# Patient Record
Sex: Male | Born: 1990 | Race: White | Hispanic: No | Marital: Married | Smoking: Former smoker
Health system: Southern US, Community
[De-identification: ages and names within clinical notes are randomized; demographics above are authoritative.]

## PROBLEM LIST (undated history)

## (undated) HISTORY — PX: NO PAST SURGERIES: SHX2092

---
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
## 2015-04-16 ENCOUNTER — Emergency Department: Payer: PRIVATE HEALTH INSURANCE

## 2015-04-16 ENCOUNTER — Emergency Department
Admission: EM | Admit: 2015-04-16 | Discharge: 2015-04-16 | Disposition: A | Discharge status: Home / Self Care | Payer: PRIVATE HEALTH INSURANCE | Attending: Emergency Medicine | Admitting: Emergency Medicine

## 2015-04-16 DIAGNOSIS — N201 Calculus of ureter: Secondary | ICD-10-CM | POA: Insufficient documentation

## 2015-04-16 DIAGNOSIS — R109 Unspecified abdominal pain: Secondary | ICD-10-CM | POA: Diagnosis present

## 2015-04-16 LAB — URINALYSIS COMPLETE WITH MICROSCOPIC (ARMC ONLY)
BILIRUBIN URINE: NEGATIVE
Bacteria, UA: NONE SEEN
Glucose, UA: NEGATIVE mg/dL
Ketones, ur: NEGATIVE mg/dL
Leukocytes, UA: NEGATIVE
Nitrite: NEGATIVE
PH: 6 (ref 5.0–8.0)
Protein, ur: 30 mg/dL — AB
Specific Gravity, Urine: 1.025 (ref 1.005–1.030)

## 2015-04-16 LAB — BASIC METABOLIC PANEL
Anion gap: 9 (ref 5–15)
BUN: 14 mg/dL (ref 6–20)
CALCIUM: 9.1 mg/dL (ref 8.9–10.3)
CHLORIDE: 105 mmol/L (ref 101–111)
CO2: 23 mmol/L (ref 22–32)
CREATININE: 1.43 mg/dL — AB (ref 0.61–1.24)
GFR calc Af Amer: >60 mL/min (ref >60)
GFR calc non Af Amer: >60 mL/min (ref >60)
Glucose, Bld: 134 mg/dL — ABNORMAL HIGH (ref 65–99)
Potassium: 4.1 mmol/L (ref 3.5–5.1)
SODIUM: 137 mmol/L (ref 135–145)

## 2015-04-16 LAB — CBC WITH DIFFERENTIAL/PLATELET
Basophils Absolute: 0.1 10*3/uL (ref 0–0.1)
Basophils Relative: 1 %
EOS ABS: 0.2 10*3/uL (ref 0–0.7)
EOS PCT: 2 %
HCT: 43.4 % (ref 40.0–52.0)
HEMOGLOBIN: 14.8 g/dL (ref 13.0–18.0)
LYMPHS ABS: 4.4 10*3/uL — AB (ref 1.0–3.6)
Lymphocytes Relative: 44 %
MCH: 29.5 pg (ref 26.0–34.0)
MCHC: 34.1 g/dL (ref 32.0–36.0)
MCV: 86.3 fL (ref 80.0–100.0)
MONO ABS: 0.9 10*3/uL (ref 0.2–1.0)
MONOS PCT: 9 %
NEUTROS PCT: 44 %
Neutro Abs: 4.5 10*3/uL (ref 1.4–6.5)
Platelets: 318 10*3/uL (ref 150–440)
RBC: 5.02 MIL/uL (ref 4.40–5.90)
RDW: 12.8 % (ref 11.5–14.5)
WBC: 10.1 10*3/uL (ref 3.8–10.6)

## 2015-04-16 MED ORDER — SODIUM CHLORIDE 0.9 % IV BOLUS (SEPSIS)
1000.0000 mL | Freq: Once | INTRAVENOUS | Status: AC
  Administered 2015-04-16: 1000 mL via INTRAVENOUS

## 2015-04-16 MED ORDER — KETOROLAC TROMETHAMINE 30 MG/ML IJ SOLN
30.0000 mg | Freq: Once | INTRAMUSCULAR | Status: AC
  Administered 2015-04-16: 30 mg via INTRAVENOUS

## 2015-04-16 MED ORDER — OXYCODONE-ACETAMINOPHEN 5-325 MG PO TABS
2.0000 | ORAL_TABLET | Freq: Once | ORAL | Status: AC
  Administered 2015-04-16: 2 via ORAL
  Filled 2015-04-16: qty 2

## 2015-04-16 MED ORDER — ONDANSETRON HCL 4 MG/2ML IJ SOLN
4.0000 mg | Freq: Once | INTRAMUSCULAR | Status: AC
  Administered 2015-04-16: 4 mg via INTRAVENOUS

## 2015-04-16 MED ORDER — OXYCODONE-ACETAMINOPHEN 5-325 MG PO TABS: 1.0000 | ORAL_TABLET | Freq: Four times a day (QID) | ORAL | Status: DC | PRN

## 2015-04-16 MED ORDER — MORPHINE SULFATE (PF) 4 MG/ML IV SOLN
INTRAVENOUS | Status: AC
  Administered 2015-04-16: 4 mg via INTRAVENOUS
  Filled 2015-04-16: qty 1

## 2015-04-16 MED ORDER — ONDANSETRON HCL 4 MG/2ML IJ SOLN
INTRAMUSCULAR | Status: AC
  Administered 2015-04-16: 4 mg via INTRAVENOUS
  Filled 2015-04-16: qty 2

## 2015-04-16 MED ORDER — ONDANSETRON 4 MG PO TBDP: 4.0000 mg | ORAL_TABLET | Freq: Three times a day (TID) | ORAL | Status: DC | PRN

## 2015-04-16 MED ORDER — KETOROLAC TROMETHAMINE 30 MG/ML IJ SOLN
INTRAMUSCULAR | Status: AC
  Administered 2015-04-16: 30 mg via INTRAVENOUS
  Filled 2015-04-16: qty 1

## 2015-04-16 MED ORDER — MORPHINE SULFATE (PF) 4 MG/ML IV SOLN
4.0000 mg | Freq: Once | INTRAVENOUS | Status: AC
  Administered 2015-04-16: 4 mg via INTRAVENOUS

## 2015-04-16 MED ORDER — TAMSULOSIN HCL 0.4 MG PO CAPS: 0.4000 mg | ORAL_CAPSULE | Freq: Every day | ORAL | Status: DC

## 2015-04-16 NOTE — ED Provider Notes (Signed)
[facility] Emergency Department Provider Note  Time seen: 6:35 AM  I have reviewed the triage vital signs and the nursing notes.   HISTORY  Chief Complaint Flank Pain    HPI Samuel Davila is a 25 y.o. male with no past medical history who presents the emergency department left flank pain beginning approximate 5 hours ago. According to the patient around 1 AM he began with severe left flank pain sharp in quality. Nausea but denies vomiting. Denies history of kidney stones in the past. Denies fever, dysuria or hematuria.   No past medical history on file.  There are no active problems to display for this patient.   No past surgical history on file.  No current outpatient prescriptions on file.  Allergies Review of patient's allergies indicates no known allergies.  No family history on file.  Social History Social History  Substance Use Topics  . Smoking status: Not on file  . Smokeless tobacco: Not on file  . Alcohol Use: Not on file    Review of Systems Constitutional: Negative for fever. Cardiovascular: Negative for chest pain. Respiratory: Negative for shortness of breath. Gastrointestinal: Left flank pain. Positive for nausea. Negative for vomiting or diarrhea. Genitourinary: Negative for dysuria. Musculoskeletal: Moderate left back pain 10-point ROS otherwise negative.  ____________________________________________   PHYSICAL EXAM:  VITAL SIGNS: ED Triage Vitals  Enc Vitals Group     BP 04/16/15 0406 155/123 mmHg     Pulse Rate 04/16/15 0406 89     Resp 04/16/15 0406 18     Temp 04/16/15 0406 97.9 F (36.6 C)     Temp src --      SpO2 04/16/15 0406 98 %     Weight 04/16/15 0406 250 lb (113.399 kg)     Height --      Head Cir --      Peak Flow --      Pain Score 04/16/15 0407 8     Pain Loc --      Pain Edu? --      Excl. in GC? --     Constitutional: Alert and oriented. Well appearing and in no distress. Eyes: Normal  exam ENT   Head: Normocephalic and atraumatic.   Mouth/Throat: Mucous membranes are moist. Cardiovascular: Normal rate, regular rhythm. No murmur Respiratory: Normal respiratory effort without tachypnea nor retractions. Breath sounds are clear Gastrointestinal: Soft and nontender. No distention.  No CVA tenderness Musculoskeletal: Nontender with normal range of motion in all extremities. Neurologic:  Normal speech and language. No gross focal neurologic deficits Skin:  Skin is warm, dry and intact.  Psychiatric: Mood and affect are normal.   ____________________________________________   RADIOLOGY  CT shows left UPJ stone, 3 mm.   INITIAL IMPRESSION / ASSESSMENT AND PLAN / ED COURSE  Pertinent labs & imaging results that were available during my care of the patient were reviewed by me and considered in my medical decision making (see chart for details).  CT consistent with 3 mm left-sided ureteral pelvic junction stone. Urinalysis consistent with hematuria. Normal white blood cell count. Afebrile, nitrite negative. Exam consistent with kidney stone. Patient initially very uncomfortable with considerable amount of pain however received morphine and Toradol prior to my evaluation. Upon my evaluation patient rates the pain is a 5/10 currently. We will dose Percocet, discharge with Percocet, Zofran, Flomax, urinary strainer and urology follow-up as this is the patient's first stone. Patient agreeable to plan.  ____________________________________________   FINAL CLINICAL  IMPRESSION(S) / ED DIAGNOSES  Left ureterolithiasis   Minna AntisKevin Samara Stankowski, MD 04/16/15 [phone]

## 2015-04-16 NOTE — ED Notes (Signed)
MD at bedside. 

## 2015-04-16 NOTE — ED Notes (Signed)

## 2015-04-16 NOTE — ED Notes (Signed)
Pt reports sudden onset left sided flank pain. Pt is pacing in triage and visible in mild distress

## 2015-04-16 NOTE — Discharge Instructions (Signed)
Kidney Stones °Kidney stones (urolithiasis) are deposits that form inside your kidneys. The intense pain is caused by the stone moving through the urinary tract. When the stone moves, the ureter goes into spasm around the stone. The stone is usually passed in the urine.  °CAUSES  °· A disorder that makes certain neck glands produce too much parathyroid hormone (primary hyperparathyroidism). °· A buildup of uric acid crystals, similar to gout in your joints. °· Narrowing (stricture) of the ureter. °· A kidney obstruction present at birth (congenital obstruction). °· Previous surgery on the kidney or ureters. °· Numerous kidney infections. °SYMPTOMS  °· Feeling sick to your stomach (nauseous). °· Throwing up (vomiting). °· Blood in the urine (hematuria). °· Pain that usually spreads (radiates) to the groin. °· Frequency or urgency of urination. °DIAGNOSIS  °· Taking a history and physical exam. °· Blood or urine tests. °· CT scan. °· Occasionally, an examination of the inside of the urinary bladder (cystoscopy) is performed. °TREATMENT  °· Observation. °· Increasing your fluid intake. °· Extracorporeal shock wave lithotripsy--This is a noninvasive procedure that uses shock waves to break up kidney stones. °· Surgery may be needed if you have severe pain or persistent obstruction. There are various surgical procedures. Most of the procedures are performed with the use of small instruments. Only small incisions are needed to accommodate these instruments, so recovery time is minimized. °The size, location, and chemical composition are all important variables that will determine the proper choice of action for you. Talk to your health care provider to better understand your situation so that you will minimize the risk of injury to yourself and your kidney.  °HOME CARE INSTRUCTIONS  °· Drink enough water and fluids to keep your urine clear or pale yellow. This will help you to pass the stone or stone fragments. °· Strain  all urine through the provided strainer. Keep all particulate matter and stones for your health care provider to see. The stone causing the pain may be as small as a grain of salt. It is very important to use the strainer each and every time you pass your urine. The collection of your stone will allow your health care provider to analyze it and verify that a stone has actually passed. The stone analysis will often identify what you can do to reduce the incidence of recurrences. °· Only take over-the-counter or prescription medicines for pain, discomfort, or fever as directed by your health care provider. °· Keep all follow-up visits as told by your health care provider. This is important. °· Get follow-up X-rays if required. The absence of pain does not always mean that the stone has passed. It may have only stopped moving. If the urine remains completely obstructed, it can cause loss of kidney function or even complete destruction of the kidney. It is your responsibility to make sure X-rays and follow-ups are completed. Ultrasounds of the kidney can show blockages and the status of the kidney. Ultrasounds are not associated with any radiation and can be performed easily in a matter of minutes. °· Make changes to your daily diet as told by your health care provider. You may be told to: °¨ Limit the amount of salt that you eat. °¨ Eat 5 or more servings of fruits and vegetables each day. °¨ Limit the amount of meat, poultry, fish, and eggs that you eat. °· Collect a 24-hour urine sample as told by your health care provider. You may need to collect another urine sample every 6-12   months. °SEEK MEDICAL CARE IF: °· You experience pain that is progressive and unresponsive to any pain medicine you have been prescribed. °SEEK IMMEDIATE MEDICAL CARE IF:  °· Pain cannot be controlled with the prescribed medicine. °· You have a fever or shaking chills. °· The severity or intensity of pain increases over 18 hours and is not  relieved by pain medicine. °· You develop a new onset of abdominal pain. °· You feel faint or pass out. °· You are unable to urinate. °  °This information is not intended to replace advice given to you by your health care provider. Make sure you discuss any questions you have with your health care provider. °  °Document Released: 01/23/2005 Document Revised: 10/14/2014 Document Reviewed: 06/26/2012 °Elsevier Interactive Patient Education ©2016 [employer] ° °

## 2015-04-16 NOTE — ED Notes (Signed)
Pt ambulated to treatment room with steady gait. Pt present to ED with c/o left flank pain, reports began approx 5 hours ago. (+) nausea. Pt denies vomiting, fever, dysuria or hematuria. Denies hx of kidney stones. Pt alert and oriented x 4, skin warm and dry, no increased work in breathing noted. Family at bedside.

## 2018-02-18 ENCOUNTER — Other Ambulatory Visit: Payer: Self-pay

## 2018-02-18 ENCOUNTER — Ambulatory Visit
Admission: EM | Admit: 2018-02-18 | Discharge: 2018-02-18 | Disposition: A | Discharge status: Home / Self Care | Payer: PRIVATE HEALTH INSURANCE | Attending: Family Medicine | Admitting: Family Medicine

## 2018-02-18 DIAGNOSIS — R05 Cough: Secondary | ICD-10-CM

## 2018-02-18 DIAGNOSIS — R059 Cough, unspecified: Secondary | ICD-10-CM

## 2018-02-18 DIAGNOSIS — J0100 Acute maxillary sinusitis, unspecified: Secondary | ICD-10-CM | POA: Insufficient documentation

## 2018-02-18 MED ORDER — HYDROCOD POLST-CPM POLST ER 10-8 MG/5ML PO SUER: 5.0000 mL | Freq: Two times a day (BID) | ORAL | 0 refills | Status: DC

## 2018-02-18 MED ORDER — AMOXICILLIN 875 MG PO TABS: 875.0000 mg | ORAL_TABLET | Freq: Two times a day (BID) | ORAL | 0 refills | Status: DC

## 2018-02-18 NOTE — ED Provider Notes (Signed)
[facility]    CSN: [id number] Arrival date & time: 02/18/18  0802     History   Chief Complaint Chief Complaint  Patient presents with  . Nasal Congestion    HPI Samuel Davila is a 28 y.o. male.   The history is provided by the patient.  URI  Presenting symptoms: congestion, cough, facial pain and fatigue   Severity:  Moderate Onset quality:  Sudden Duration:  1 week Timing:  Constant Progression:  Worsening Chronicity:  New Relieved by:  Nothing Ineffective treatments:  OTC medications Associated symptoms: sinus pain   Risk factors: sick contacts   Risk factors: not elderly     No past medical history on file.  There are no active problems to display for this patient.        Home Medications    Prior to Admission medications   Medication Sig Start Date End Date Taking? Authorizing Provider  ondansetron (ZOFRAN ODT) 4 MG disintegrating tablet Take 1 tablet (4 mg total) by mouth every 8 (eight) hours as needed for nausea or vomiting. 04/16/15  Yes Minna AntisPaduchowski, Kevin, MD  oxyCODONE-acetaminophen (ROXICET) 5-325 MG tablet Take 1 tablet by mouth every 6 (six) hours as needed. 04/16/15  Yes Minna AntisPaduchowski, Kevin, MD  tamsulosin (FLOMAX) 0.4 MG CAPS capsule Take 1 capsule (0.4 mg total) by mouth daily. 04/16/15  Yes Minna AntisPaduchowski, Kevin, MD  amoxicillin (AMOXIL) 875 MG tablet Take 1 tablet (875 mg total) by mouth 2 (two) times daily. 02/18/18   Payton Mccallumonty, Madysin Crisp, MD  chlorpheniramine-HYDROcodone (TUSSIONEX PENNKINETIC ER) 10-8 MG/5ML SUER Take 5 mLs by mouth 2 (two) times daily. 02/18/18   Payton Mccallumonty, Kauan Kloosterman, MD    Family History No family history on file.  Social History Social History   Tobacco Use  . Smoking status: Not on file  Substance Use Topics  . Alcohol use: Not on file  . Drug use: Not on file     Allergies   Patient has no known allergies.   Review of Systems Review of Systems  Constitutional: Positive for fatigue.  HENT: Positive for  congestion and sinus pain.   Respiratory: Positive for cough.      Physical Exam Triage Vital Signs ED Triage Vitals  Enc Vitals Group     BP 02/18/18 0819 (!) 154/90     Pulse Rate 02/18/18 0819 80     Resp 02/18/18 0819 18     Temp 02/18/18 0819 98.2 F (36.8 C)     Temp Source 02/18/18 0819 Oral     SpO2 02/18/18 0819 99 %     Weight 02/18/18 0816 260 lb (117.9 kg)     Height 02/18/18 0816 5\' 11"  (1.803 m)     Head Circumference --      Peak Flow --      Pain Score 02/18/18 0815 6     Pain Loc --      Pain Edu? --      Excl. in GC? --    No data found.  Updated Vital Signs BP (!) 154/90   Pulse 80   Temp 98.2 F (36.8 C) (Oral)   Resp 18   Ht 5\' 11"  (1.803 m)   Wt 117.9 kg   SpO2 99%   BMI 36.26 kg/m   Visual Acuity Right Eye Distance:   Left Eye Distance:   Bilateral Distance:    Right Eye Near:   Left Eye Near:    Bilateral Near:     Physical Exam  Vitals signs and nursing note reviewed.  Constitutional:      General: He is not in acute distress.    Appearance: He is well-developed. He is not diaphoretic.  HENT:     Head: Normocephalic and atraumatic.     Right Ear: Tympanic membrane, ear canal and external ear normal.     Left Ear: Tympanic membrane, ear canal and external ear normal.     Nose: Rhinorrhea present.     Right Sinus: Maxillary sinus tenderness present.     Left Sinus: Maxillary sinus tenderness present.     Mouth/Throat:     Pharynx: Uvula midline. No oropharyngeal exudate.     Tonsils: No tonsillar abscesses.  Eyes:     General: No scleral icterus.       Right eye: No discharge.        Left eye: No discharge.     Conjunctiva/sclera: Conjunctivae normal.     Pupils: Pupils are equal, round, and reactive to light.  Neck:     Musculoskeletal: Normal range of motion and neck supple.     Thyroid: No thyromegaly.     Trachea: No tracheal deviation.  Cardiovascular:     Rate and Rhythm: Normal rate and regular rhythm.     Heart  sounds: Normal heart sounds.  Pulmonary:     Effort: Pulmonary effort is normal. No respiratory distress.     Breath sounds: Normal breath sounds. No stridor. No wheezing or rales.  Chest:     Chest wall: No tenderness.  Lymphadenopathy:     Cervical: No cervical adenopathy.  Skin:    General: Skin is warm and dry.     Findings: No rash.  Neurological:     Mental Status: He is alert.      UC Treatments / Results  Labs (all labs ordered are listed, but only abnormal results are displayed) Labs Reviewed - No data to display  EKG None  Radiology No results found.  Procedures Procedures (including critical care time)  Medications Ordered in UC Medications - No data to display  Initial Impression / Assessment and Plan / UC Course  I have reviewed the triage vital signs and the nursing notes.  Pertinent labs & imaging results that were available during my care of the patient were reviewed by me and considered in my medical decision making (see chart for details).      Final Clinical Impressions(s) / UC Diagnoses   Final diagnoses:  Acute maxillary sinusitis, recurrence not specified  Cough     Discharge Instructions     Flonase steroid nasal spray Ibuprofen 600-800mg  three times daily    ED Prescriptions    Medication Sig Dispense Auth. Provider   amoxicillin (AMOXIL) 875 MG tablet Take 1 tablet (875 mg total) by mouth 2 (two) times daily. 20 tablet Payton Mccallumonty, Jailee Jaquez, MD   chlorpheniramine-HYDROcodone (TUSSIONEX PENNKINETIC ER) 10-8 MG/5ML SUER Take 5 mLs by mouth 2 (two) times daily. 60 mL Payton Mccallumonty, Hector Venne, MD     1. diagnosis reviewed with patient 2. rx as per orders above; reviewed possible side effects, interactions, risks and benefits  3. Recommend supportive treatment as above 4. Follow-up prn if symptoms worsen or don't improve   Controlled Substance Prescriptions [place] Controlled Substance Registry consulted? Not Applicable   Payton Mccallumonty, Farran Amsden,  MD 02/18/18 [phone]

## 2018-02-18 NOTE — Discharge Instructions (Addendum)
Flonase steroid nasal spray Ibuprofen 600-800mg  three times daily

## 2018-02-18 NOTE — ED Triage Notes (Signed)
Patient states he has a lot of sinus pain and congestion since last Wednesday.  Patient states he hurt his back coughing last night as well

## 2018-09-02 ENCOUNTER — Other Ambulatory Visit: Payer: Self-pay

## 2018-09-02 ENCOUNTER — Ambulatory Visit
Admission: EM | Admit: 2018-09-02 | Discharge: 2018-09-02 | Disposition: A | Discharge status: Home / Self Care | Payer: Self-pay | Attending: Family Medicine | Admitting: Family Medicine

## 2018-09-02 DIAGNOSIS — E860 Dehydration: Secondary | ICD-10-CM

## 2018-09-02 DIAGNOSIS — E876 Hypokalemia: Secondary | ICD-10-CM

## 2018-09-02 DIAGNOSIS — R112 Nausea with vomiting, unspecified: Secondary | ICD-10-CM

## 2018-09-02 LAB — CBC WITH DIFFERENTIAL/PLATELET
Abs Immature Granulocytes: 0.08 10*3/uL — ABNORMAL HIGH (ref 0.00–0.07)
Basophils Absolute: 0 10*3/uL (ref 0.0–0.1)
Basophils Relative: 0 %
Eosinophils Absolute: 0 10*3/uL (ref 0.0–0.5)
Eosinophils Relative: 0 %
HCT: 47 % (ref 39.0–52.0)
Hemoglobin: 16.3 g/dL (ref 13.0–17.0)
Immature Granulocytes: 1 %
Lymphocytes Relative: 22 %
Lymphs Abs: 2.6 10*3/uL (ref 0.7–4.0)
MCH: 30.2 pg (ref 26.0–34.0)
MCHC: 34.7 g/dL (ref 30.0–36.0)
MCV: 87 fL (ref 80.0–100.0)
Monocytes Absolute: 1 10*3/uL (ref 0.1–1.0)
Monocytes Relative: 9 %
Neutro Abs: 7.9 10*3/uL — ABNORMAL HIGH (ref 1.7–7.7)
Neutrophils Relative %: 68 %
Platelets: 361 10*3/uL (ref 150–400)
RBC: 5.4 MIL/uL (ref 4.22–5.81)
RDW: 12.7 % (ref 11.5–15.5)
WBC: 11.6 10*3/uL — ABNORMAL HIGH (ref 4.0–10.5)
nRBC: 0 % (ref 0.0–0.2)

## 2018-09-02 LAB — COMPREHENSIVE METABOLIC PANEL
ALT: 85 U/L — ABNORMAL HIGH (ref 0–44)
AST: 61 U/L — ABNORMAL HIGH (ref 15–41)
Albumin: 4.6 g/dL (ref 3.5–5.0)
Alkaline Phosphatase: 70 U/L (ref 38–126)
Anion gap: 15 (ref 5–15)
BUN: 16 mg/dL (ref 6–20)
CO2: 19 mmol/L — ABNORMAL LOW (ref 22–32)
Calcium: 9.5 mg/dL (ref 8.9–10.3)
Chloride: 97 mmol/L — ABNORMAL LOW (ref 98–111)
Creatinine, Ser: 1.16 mg/dL (ref 0.61–1.24)
GFR calc Af Amer: >60 mL/min (ref >60)
GFR calc non Af Amer: >60 mL/min (ref >60)
Glucose, Bld: 117 mg/dL — ABNORMAL HIGH (ref 70–99)
Potassium: 3.1 mmol/L — ABNORMAL LOW (ref 3.5–5.1)
Sodium: 131 mmol/L — ABNORMAL LOW (ref 135–145)
Total Bilirubin: 1 mg/dL (ref 0.3–1.2)
Total Protein: 8.2 g/dL — ABNORMAL HIGH (ref 6.5–8.1)

## 2018-09-02 LAB — LIPASE, BLOOD: Lipase: 32 U/L (ref 11–51)

## 2018-09-02 MED ORDER — ONDANSETRON HCL 4 MG/2ML IJ SOLN
8.0000 mg | Freq: Once | INTRAMUSCULAR | Status: AC
  Administered 2018-09-02: 8 mg via INTRAMUSCULAR

## 2018-09-02 MED ORDER — PROMETHAZINE HCL 25 MG/ML IJ SOLN
25.0000 mg | Freq: Once | INTRAMUSCULAR | Status: AC
Start: 2018-09-02 — End: 2018-09-02
  Administered 2018-09-02: 25 mg via INTRAMUSCULAR

## 2018-09-02 MED ORDER — PROMETHAZINE HCL 25 MG PO TABS: 25.0000 mg | ORAL_TABLET | Freq: Three times a day (TID) | ORAL | 0 refills | Status: DC | PRN

## 2018-09-02 MED ORDER — ONDANSETRON 4 MG PO TBDP: 4.0000 mg | ORAL_TABLET | Freq: Three times a day (TID) | ORAL | 0 refills | Status: DC | PRN

## 2018-09-02 MED ORDER — SODIUM CHLORIDE 0.9 % IV BOLUS
1000.0000 mL | Freq: Once | INTRAVENOUS | Status: AC
  Administered 2018-09-02: 1000 mL via INTRAVENOUS

## 2018-09-02 MED ORDER — POTASSIUM CHLORIDE CRYS ER 20 MEQ PO TBCR: 20.0000 meq | EXTENDED_RELEASE_TABLET | Freq: Two times a day (BID) | ORAL | 0 refills | Status: DC

## 2018-09-02 MED ORDER — PANTOPRAZOLE SODIUM 20 MG PO TBEC: 40.0000 mg | DELAYED_RELEASE_TABLET | Freq: Every day | ORAL | 0 refills | Status: DC

## 2018-09-02 NOTE — ED Provider Notes (Signed)
[facility]    CSN: [id number] Arrival date & time: 09/02/18  0802   History   Chief Complaint Chief Complaint  Patient presents with  . Abdominal Pain   HPI  28 year old male presents with nausea, vomiting, diarrhea.  Patient reports that he has had nausea and vomiting for the past 36 hours.  He had some diarrhea early on but this seems to have resolved.  He reports associated upper abdominal pain.  He states that he saw some red color to his emesis.  He is unsure of whether he had hematemesis or not.  Patient reports continued nausea.  Mild pain.  Patient states that he does not feel well.  He states that he feels "weird".  Patient feels like he may pass out.  He is breathing heavily in the room.  No documented fever.  No reported sick contacts.  No known exacerbating or relieving factors.  History reviewed and updated as below.  PMH: Hx of kidney stone, Hx of sinusitis  Past Surgical History:  Procedure Laterality Date  . NO PAST SURGERIES     Home Medications    Prior to Admission medications   Medication Sig Start Date End Date Taking? Authorizing Provider  ondansetron (ZOFRAN-ODT) 4 MG disintegrating tablet Take 1-2 tablets (4-8 mg total) by mouth every 8 (eight) hours as needed for nausea or vomiting. 09/02/18   Thersa Salt G, DO  pantoprazole (PROTONIX) 20 MG tablet Take 2 tablets (40 mg total) by mouth daily. 09/02/18   Coral Spikes, DO  potassium chloride SA (K-DUR) 20 MEQ tablet Take 1 tablet (20 mEq total) by mouth 2 (two) times daily for 3 days. 09/02/18 09/05/18  Coral Spikes, DO  promethazine (PHENERGAN) 25 MG tablet Take 1 tablet (25 mg total) by mouth every 8 (eight) hours as needed for nausea, vomiting or refractory nausea / vomiting. 09/02/18   Coral Spikes, DO   Social History Social History   Tobacco Use  . Smoking status: Current Every Day Smoker    Packs/day: 1.00    Types: Cigarettes  . Smokeless tobacco: Never Used  Substance Use Topics   . Alcohol use: Yes    Comment: occasionally  . Drug use: Yes    Types: Marijuana    Allergies   Patient has no known allergies.   Review of Systems Review of Systems  Constitutional: Positive for appetite change. Negative for fever.  Gastrointestinal: Positive for abdominal pain, diarrhea, nausea and vomiting.   Physical Exam Triage Vital Signs ED Triage Vitals  Enc Vitals Group     BP 09/02/18 0817 140/74     Pulse Rate 09/02/18 0817 83     Resp 09/02/18 0817 18     Temp 09/02/18 0817 97.8 F (36.6 C)     Temp Source 09/02/18 0817 Oral     SpO2 09/02/18 0817 98 %     Weight 09/02/18 0813 250 lb (113.4 kg)     Height 09/02/18 0813 5\' 10"  (1.778 m)     Head Circumference --      Peak Flow --      Pain Score 09/02/18 0813 4     Pain Loc --      Pain Edu? --      Excl. in [place]? --    Updated Vital Signs BP 140/74 (BP Location: Left Arm)   Pulse 83   Temp 97.8 F (36.6 C) (Oral)   Resp 18   Ht 5\' 10"  (1.778 m)  Wt 113.4 kg   SpO2 98%   BMI 35.87 kg/m   Visual Acuity Right Eye Distance:   Left Eye Distance:   Bilateral Distance:    Right Eye Near:   Left Eye Near:    Bilateral Near:     Physical Exam Vitals signs and nursing note reviewed.  Constitutional:      General: He is not in acute distress.    Appearance: Normal appearance.  HENT:     Head: Normocephalic and atraumatic.  Eyes:     General:        Right eye: No discharge.        Left eye: No discharge.     Conjunctiva/sclera: Conjunctivae normal.  Cardiovascular:     Rate and Rhythm: Normal rate and regular rhythm.  Pulmonary:     Breath sounds: Normal breath sounds.     Comments: Patient breathing heavily. Abdominal:     Palpations: Abdomen is soft.     Comments: Protuberant abdomen. Soft. Nontender.  Neurological:     Mental Status: He is alert.  Psychiatric:        Mood and Affect: Mood normal.        Behavior: Behavior normal.    UC Treatments / Results  Labs (all labs ordered  are listed, but only abnormal results are displayed) Labs Reviewed  COMPREHENSIVE METABOLIC PANEL - Abnormal; Notable for the following components:      Result Value   Sodium 131 (*)    Potassium 3.1 (*)    Chloride 97 (*)    CO2 19 (*)    Glucose, Bld 117 (*)    Total Protein 8.2 (*)    AST 61 (*)    ALT 85 (*)    All other components within normal limits  CBC WITH DIFFERENTIAL/PLATELET - Abnormal; Notable for the following components:   WBC 11.6 (*)    Neutro Abs 7.9 (*)    Abs Immature Granulocytes 0.08 (*)    All other components within normal limits  LIPASE, BLOOD    EKG   Radiology No results found.  Procedures Procedures (including critical care time)  Medications Ordered in UC Medications  promethazine (PHENERGAN) injection 25 mg (has no administration in time range)  ondansetron (ZOFRAN) injection 8 mg (8 mg Intramuscular Given 09/02/18 0853)  sodium chloride 0.9 % bolus 1,000 mL (1,000 mLs Intravenous New Bag/Given 09/02/18 0852)    Initial Impression / Assessment and Plan / UC Course  I have reviewed the triage vital signs and the nursing notes.  Pertinent labs & imaging results that were available during my care of the patient were reviewed by me and considered in my medical decision making (see chart for details).    28 year old male presents with suspected gastroenteritis.  Associated hypokalemia and dehydration.  No abdominal tenderness on exam.  No indications for further work-up with imaging at this time.  Patient given IV fluids, Zofran, and Phenergan here.  Discharging home on Zofran, Phenergan, Kdur, and protonix.  Final Clinical Impressions(s) / UC Diagnoses   Final diagnoses:  Intractable vomiting with nausea, unspecified vomiting type  Hypokalemia  Dehydration     Discharge Instructions     Medication as prescribed.  Lots of fluids.  If you worsen, please let me know.  Take care  Dr. Adriana Simasook     ED Prescriptions    Medication Sig  Dispense Auth. Provider   ondansetron (ZOFRAN-ODT) 4 MG disintegrating tablet Take 1-2 tablets (4-8 mg total) by mouth  every 8 (eight) hours as needed for nausea or vomiting. 20 tablet Sala Tague G, DO   potassium chloride SA (K-DUR) 20 MEQ tablet Take 1 tablet (20 mEq total) by mouth 2 (two) times daily for 3 days. 6 tablet Nikki Rusnak G, DO   promethazine (PHENERGAN) 25 MG tablet Take 1 tablet (25 mg total) by mouth every 8 (eight) hours as needed for nausea, vomiting or refractory nausea / vomiting. 30 tablet Georgia Baria G, DO   pantoprazole (PROTONIX) 20 MG tablet Take 2 tablets (40 mg total) by mouth daily. 30 tablet Tommie Samsook, Sareen Randon G, DO     Controlled Substance Prescriptions [place] Controlled Substance Registry consulted? Not Applicable   Tommie SamsCook, Shyrl Obi G, DO 09/02/18 0930

## 2018-09-02 NOTE — Discharge Instructions (Signed)
Medication as prescribed.  Lots of fluids.  If you worsen, please let me know.  Take care  Dr. Lacinda Axon

## 2018-09-02 NOTE — ED Triage Notes (Signed)
Patient states that he has upper abdominal pain that started 36 hours ago with vomiting. Patient states that he had continued vomiting and has noticed some blood in vomiting. Patient in triage complaining of feeling like he may pass out.

## 2018-09-04 ENCOUNTER — Ambulatory Visit
Admission: EM | Admit: 2018-09-04 | Discharge: 2018-09-04 | Disposition: A | Discharge status: Home / Self Care | Payer: Self-pay | Attending: Family Medicine | Admitting: Family Medicine

## 2018-09-04 ENCOUNTER — Ambulatory Visit (INDEPENDENT_AMBULATORY_CARE_PROVIDER_SITE_OTHER): Payer: Self-pay

## 2018-09-04 ENCOUNTER — Encounter: Payer: Self-pay | Admitting: Emergency Medicine

## 2018-09-04 ENCOUNTER — Other Ambulatory Visit: Payer: Self-pay

## 2018-09-04 DIAGNOSIS — R112 Nausea with vomiting, unspecified: Secondary | ICD-10-CM

## 2018-09-04 DIAGNOSIS — K529 Noninfective gastroenteritis and colitis, unspecified: Secondary | ICD-10-CM

## 2018-09-04 LAB — COMPREHENSIVE METABOLIC PANEL
ALT: 74 U/L — ABNORMAL HIGH (ref 0–44)
AST: 38 U/L (ref 15–41)
Albumin: 4.3 g/dL (ref 3.5–5.0)
Alkaline Phosphatase: 64 U/L (ref 38–126)
Anion gap: 12 (ref 5–15)
BUN: 10 mg/dL (ref 6–20)
CO2: 20 mmol/L — ABNORMAL LOW (ref 22–32)
Calcium: 9.2 mg/dL (ref 8.9–10.3)
Chloride: 104 mmol/L (ref 98–111)
Creatinine, Ser: 1.06 mg/dL (ref 0.61–1.24)
GFR calc Af Amer: >60 mL/min (ref >60)
GFR calc non Af Amer: >60 mL/min (ref >60)
Glucose, Bld: 98 mg/dL (ref 70–99)
Potassium: 4 mmol/L (ref 3.5–5.1)
Sodium: 136 mmol/L (ref 135–145)
Total Bilirubin: 0.4 mg/dL (ref 0.3–1.2)
Total Protein: 7.6 g/dL (ref 6.5–8.1)

## 2018-09-04 LAB — CBC WITH DIFFERENTIAL/PLATELET
Abs Immature Granulocytes: 0.11 10*3/uL — ABNORMAL HIGH (ref 0.00–0.07)
Basophils Absolute: 0 10*3/uL (ref 0.0–0.1)
Basophils Relative: 0 %
Eosinophils Absolute: 0.1 10*3/uL (ref 0.0–0.5)
Eosinophils Relative: 1 %
HCT: 45.4 % (ref 39.0–52.0)
Hemoglobin: 15.7 g/dL (ref 13.0–17.0)
Immature Granulocytes: 1 %
Lymphocytes Relative: 20 %
Lymphs Abs: 2.4 10*3/uL (ref 0.7–4.0)
MCH: 30.4 pg (ref 26.0–34.0)
MCHC: 34.6 g/dL (ref 30.0–36.0)
MCV: 87.8 fL (ref 80.0–100.0)
Monocytes Absolute: 0.6 10*3/uL (ref 0.1–1.0)
Monocytes Relative: 5 %
Neutro Abs: 8.7 10*3/uL — ABNORMAL HIGH (ref 1.7–7.7)
Neutrophils Relative %: 73 %
Platelets: 341 10*3/uL (ref 150–400)
RBC: 5.17 MIL/uL (ref 4.22–5.81)
RDW: 12.1 % (ref 11.5–15.5)
WBC: 12 10*3/uL — ABNORMAL HIGH (ref 4.0–10.5)
nRBC: 0 % (ref 0.0–0.2)

## 2018-09-04 LAB — LIPASE, BLOOD: Lipase: 36 U/L (ref 11–51)

## 2018-09-04 MED ORDER — PROMETHAZINE HCL 25 MG/ML IJ SOLN
25.0000 mg | INTRAMUSCULAR | Status: AC
  Administered 2018-09-04: 19:00:00 25 mg via INTRAVENOUS

## 2018-09-04 MED ORDER — SODIUM CHLORIDE 0.9 % IV BOLUS
1000.0000 mL | Freq: Once | INTRAVENOUS | Status: AC
  Administered 2018-09-04: 1000 mL via INTRAVENOUS

## 2018-09-04 MED ORDER — ONDANSETRON 8 MG PO TBDP
8.0000 mg | ORAL_TABLET | Freq: Once | ORAL | Status: AC
  Administered 2018-09-04: 8 mg via ORAL

## 2018-09-04 NOTE — ED Triage Notes (Signed)
Patient was seen here on Monday for nausea, vomiting and diarrhea and abdominal pain. He was given IV fluids and sent home with Zofran, Protonix, Potassium and Phenergan. He states his symptoms resolved until 12 today when they returned.

## 2018-09-04 NOTE — Discharge Instructions (Addendum)
Clear liquids then advance diet slowly as tolerated Continue current medications Bring in stool samples tomorrow Go to the Emergency Department if symptoms worsen

## 2018-09-04 NOTE — ED Provider Notes (Signed)
[facility]    CSN: [id number] Arrival date & time: 09/04/18  1727     History   Chief Complaint Chief Complaint  Patient presents with  . Nausea  . Emesis  . Diarrhea    HPI Samuel Davila is a 28 y.o. male.   28 yo male with a c/o nausea, vomiting and diarrhea for the past 5 days. He was seen here 2 days ago with suspected acute gastroenteritis and given IVF plus zofran, protonix, potassium with improvement. States he was feeling better yesterday and today until noon when symptoms returned with nausea, vomiting and loose stools. Denies any fevers, chills, melena, hematochezia or hematemesis. Abdominal pain is cramping. Patient states he ate a [employer] this morning. Denies any sick contacts, recent travel or suspicious foods.    Emesis Associated symptoms: diarrhea   Diarrhea Associated symptoms: vomiting     History reviewed. No pertinent past medical history.  There are no active problems to display for this patient.   Past Surgical History:  Procedure Laterality Date  . NO PAST SURGERIES         Home Medications    Prior to Admission medications   Medication Sig Start Date End Date Taking? Authorizing Provider  ondansetron (ZOFRAN-ODT) 4 MG disintegrating tablet Take 1-2 tablets (4-8 mg total) by mouth every 8 (eight) hours as needed for nausea or vomiting. 09/02/18  Yes Cook, Jayce G, DO  pantoprazole (PROTONIX) 20 MG tablet Take 2 tablets (40 mg total) by mouth daily. 09/02/18  Yes Cook, Jayce G, DO  potassium chloride SA (K-DUR) 20 MEQ tablet Take 1 tablet (20 mEq total) by mouth 2 (two) times daily for 3 days. 09/02/18 09/05/18 Yes Cook, Jayce G, DO  promethazine (PHENERGAN) 25 MG tablet Take 1 tablet (25 mg total) by mouth every 8 (eight) hours as needed for nausea, vomiting or refractory nausea / vomiting. 09/02/18  Yes Tommie Samsook, Jayce G, DO    Family History History reviewed. No pertinent family history.  Social History Social History   Tobacco  Use  . Smoking status: Current Every Day Smoker    Packs/day: 1.00    Types: Cigarettes  . Smokeless tobacco: Never Used  Substance Use Topics  . Alcohol use: Yes    Comment: occasionally  . Drug use: Yes    Types: Marijuana     Allergies   Patient has no known allergies.   Review of Systems Review of Systems  Gastrointestinal: Positive for diarrhea and vomiting.     Physical Exam Triage Vital Signs ED Triage Vitals  Enc Vitals Group     BP 09/04/18 1800 130/86     Pulse Rate 09/04/18 1800 69     Resp 09/04/18 1800 18     Temp 09/04/18 1800 98.5 F (36.9 C)     Temp Source 09/04/18 1800 Oral     SpO2 09/04/18 1800 100 %     Weight 09/04/18 1758 250 lb (113.4 kg)     Height 09/04/18 1758 5\' 11"  (1.803 m)     Head Circumference --      Peak Flow --      Pain Score 09/04/18 1758 8     Pain Loc --      Pain Edu? --      Excl. in GC? --    No data found.  Updated Vital Signs BP 130/86 (BP Location: Right Arm)   Pulse 69   Temp 98.5 F (36.9 C) (Oral)   Resp  18   Ht 5\' 11"  (1.803 m)   Wt 113.4 kg   SpO2 100%   BMI 34.87 kg/m   Visual Acuity Right Eye Distance:   Left Eye Distance:   Bilateral Distance:    Right Eye Near:   Left Eye Near:    Bilateral Near:     Physical Exam Vitals signs and nursing note reviewed.  Constitutional:      General: He is not in acute distress.    Appearance: He is not toxic-appearing or diaphoretic.  Abdominal:     General: Bowel sounds are normal. There is no distension.     Palpations: Abdomen is soft. There is no mass.     Tenderness: There is abdominal tenderness (mild, diffuse; no rebound or guarding). There is no right CVA tenderness, left CVA tenderness, guarding or rebound.     Hernia: No hernia is present.  Neurological:     Mental Status: He is alert.      UC Treatments / Results  Labs (all labs ordered are listed, but only abnormal results are displayed) Labs Reviewed  CBC WITH  DIFFERENTIAL/PLATELET - Abnormal; Notable for the following components:      Result Value   WBC 12.0 (*)    Neutro Abs 8.7 (*)    Abs Immature Granulocytes 0.11 (*)    All other components within normal limits  COMPREHENSIVE METABOLIC PANEL - Abnormal; Notable for the following components:   CO2 20 (*)    ALT 74 (*)    All other components within normal limits  LIPASE, BLOOD    EKG   Radiology Dg Abd 2 Views  Result Date: 09/04/2018 CLINICAL DATA:  Nausea vomiting EXAM: ABDOMEN - 2 VIEW COMPARISON:  CT 04/16/2015 FINDINGS: The bowel gas pattern is normal. There is no evidence of free air. No radio-opaque calculi or other significant radiographic abnormality is seen. IMPRESSION: Negative. Electronically Signed   By: Donavan Foil M.D.   On: 09/04/2018 19:09    Procedures Procedures (including critical care time)  Medications Ordered in UC Medications  ondansetron (ZOFRAN-ODT) disintegrating tablet 8 mg (8 mg Oral Given 09/04/18 1804)  sodium chloride 0.9 % bolus 1,000 mL (1,000 mLs Intravenous New Bag/Given 09/04/18 1831)  promethazine (PHENERGAN) injection 25 mg (25 mg Intravenous Given 09/04/18 1917)    Initial Impression / Assessment and Plan / UC Course  I have reviewed the triage vital signs and the nursing notes.  Pertinent labs & imaging results that were available during my care of the patient were reviewed by me and considered in my medical decision making (see chart for details).      Final Clinical Impressions(s) / UC Diagnoses   Final diagnoses:  Gastroenteritis     Discharge Instructions     Clear liquids then advance diet slowly as tolerated Continue current medications Bring in stool samples tomorrow Go to the Emergency Department if symptoms worsen    ED Prescriptions    None      1. Labs/x-ray results and diagnosis reviewed with patient 2. Given IVF, zofran and phenergan with improvement of symptoms  3. Continue current home medications 4.  Recommend supportive treatment as abovwe 5. Check stool GI panel 6.  Go to Emergency Department for further evaluation if symptoms worsen or are not improving 7. Follow-up prn   Controlled Substance Prescriptions [place] Controlled Substance Registry consulted? Not Applicable   Norval Gable, MD 09/04/18 [phone]

## 2018-09-05 ENCOUNTER — Other Ambulatory Visit
Admission: RE | Admit: 2018-09-05 | Discharge: 2018-09-05 | Disposition: A | Discharge status: Home / Self Care | Payer: Self-pay | Source: Ambulatory Visit | Attending: Family Medicine | Admitting: Family Medicine

## 2018-09-05 DIAGNOSIS — R197 Diarrhea, unspecified: Secondary | ICD-10-CM | POA: Insufficient documentation

## 2018-09-05 LAB — GASTROINTESTINAL PANEL BY PCR, STOOL (REPLACES STOOL CULTURE)

## 2019-01-12 ENCOUNTER — Emergency Department: Payer: Self-pay

## 2019-01-12 ENCOUNTER — Observation Stay
Admission: EM | Admit: 2019-01-12 | Discharge: 2019-01-14 | Disposition: A | Discharge status: Home / Self Care | Payer: Self-pay | Attending: Family Medicine | Admitting: Family Medicine

## 2019-01-12 ENCOUNTER — Other Ambulatory Visit: Payer: Self-pay

## 2019-01-12 DIAGNOSIS — F329 Major depressive disorder, single episode, unspecified: Secondary | ICD-10-CM | POA: Insufficient documentation

## 2019-01-12 DIAGNOSIS — T65891A Toxic effect of other specified substances, accidental (unintentional), initial encounter: Principal | ICD-10-CM | POA: Insufficient documentation

## 2019-01-12 DIAGNOSIS — R4182 Altered mental status, unspecified: Secondary | ICD-10-CM | POA: Insufficient documentation

## 2019-01-12 DIAGNOSIS — T50901A Poisoning by unspecified drugs, medicaments and biological substances, accidental (unintentional), initial encounter: Secondary | ICD-10-CM

## 2019-01-12 DIAGNOSIS — Y929 Unspecified place or not applicable: Secondary | ICD-10-CM | POA: Insufficient documentation

## 2019-01-12 DIAGNOSIS — E878 Other disorders of electrolyte and fluid balance, not elsewhere classified: Secondary | ICD-10-CM | POA: Insufficient documentation

## 2019-01-12 DIAGNOSIS — F419 Anxiety disorder, unspecified: Secondary | ICD-10-CM | POA: Insufficient documentation

## 2019-01-12 DIAGNOSIS — N179 Acute kidney failure, unspecified: Secondary | ICD-10-CM | POA: Diagnosis present

## 2019-01-12 DIAGNOSIS — F17210 Nicotine dependence, cigarettes, uncomplicated: Secondary | ICD-10-CM | POA: Insufficient documentation

## 2019-01-12 DIAGNOSIS — Z20828 Contact with and (suspected) exposure to other viral communicable diseases: Secondary | ICD-10-CM | POA: Insufficient documentation

## 2019-01-12 DIAGNOSIS — R55 Syncope and collapse: Secondary | ICD-10-CM | POA: Insufficient documentation

## 2019-01-12 DIAGNOSIS — E860 Dehydration: Secondary | ICD-10-CM | POA: Insufficient documentation

## 2019-01-12 DIAGNOSIS — R112 Nausea with vomiting, unspecified: Secondary | ICD-10-CM | POA: Insufficient documentation

## 2019-01-12 DIAGNOSIS — Z79899 Other long term (current) drug therapy: Secondary | ICD-10-CM | POA: Insufficient documentation

## 2019-01-12 DIAGNOSIS — Z7901 Long term (current) use of anticoagulants: Secondary | ICD-10-CM | POA: Insufficient documentation

## 2019-01-12 DIAGNOSIS — R000 Tachycardia, unspecified: Secondary | ICD-10-CM | POA: Insufficient documentation

## 2019-01-12 LAB — CBC
HCT: 45 % (ref 39.0–52.0)
Hemoglobin: 15.2 g/dL (ref 13.0–17.0)
MCH: 29.4 pg (ref 26.0–34.0)
MCHC: 33.8 g/dL (ref 30.0–36.0)
MCV: 87 fL (ref 80.0–100.0)
Platelets: 313 10*3/uL (ref 150–400)
RBC: 5.17 MIL/uL (ref 4.22–5.81)
RDW: 12.7 % (ref 11.5–15.5)
WBC: 13.1 10*3/uL — ABNORMAL HIGH (ref 4.0–10.5)
nRBC: 0 % (ref 0.0–0.2)

## 2019-01-12 LAB — URINE DRUG SCREEN, QUALITATIVE (ARMC ONLY)
Amphetamines, Ur Screen: NOT DETECTED
Barbiturates, Ur Screen: NOT DETECTED
Benzodiazepine, Ur Scrn: NOT DETECTED
Cannabinoid 50 Ng, Ur ~~LOC~~: NOT DETECTED
Cocaine Metabolite,Ur ~~LOC~~: NOT DETECTED
MDMA (Ecstasy)Ur Screen: NOT DETECTED
Methadone Scn, Ur: NOT DETECTED
Opiate, Ur Screen: NOT DETECTED
Phencyclidine (PCP) Ur S: NOT DETECTED
Tricyclic, Ur Screen: NOT DETECTED

## 2019-01-12 LAB — SALICYLATE LEVEL: Salicylate Lvl: <7 mg/dL (ref 2.8–30.0)

## 2019-01-12 LAB — COMPREHENSIVE METABOLIC PANEL
ALT: 39 U/L (ref 0–44)
AST: 51 U/L — ABNORMAL HIGH (ref 15–41)
Albumin: 4.5 g/dL (ref 3.5–5.0)
Alkaline Phosphatase: 85 U/L (ref 38–126)
Anion gap: 18 — ABNORMAL HIGH (ref 5–15)
BUN: 34 mg/dL — ABNORMAL HIGH (ref 6–20)
CO2: 21 mmol/L — ABNORMAL LOW (ref 22–32)
Calcium: 8.9 mg/dL (ref 8.9–10.3)
Chloride: 92 mmol/L — ABNORMAL LOW (ref 98–111)
Creatinine, Ser: 3.3 mg/dL — ABNORMAL HIGH (ref 0.61–1.24)
GFR calc Af Amer: 28 mL/min — ABNORMAL LOW (ref >60)
GFR calc non Af Amer: 24 mL/min — ABNORMAL LOW (ref >60)
Glucose, Bld: 151 mg/dL — ABNORMAL HIGH (ref 70–99)
Potassium: 3.2 mmol/L — ABNORMAL LOW (ref 3.5–5.1)
Sodium: 131 mmol/L — ABNORMAL LOW (ref 135–145)
Total Bilirubin: 0.9 mg/dL (ref 0.3–1.2)
Total Protein: 8.9 g/dL — ABNORMAL HIGH (ref 6.5–8.1)

## 2019-01-12 LAB — CK: Total CK: 641 U/L — ABNORMAL HIGH (ref 49–397)

## 2019-01-12 LAB — MAGNESIUM: Magnesium: 2.7 mg/dL — ABNORMAL HIGH (ref 1.7–2.4)

## 2019-01-12 LAB — ACETAMINOPHEN LEVEL: Acetaminophen (Tylenol), Serum: <10 ug/mL — ABNORMAL LOW (ref 10–30)

## 2019-01-12 LAB — ETHANOL: Alcohol, Ethyl (B): <10 mg/dL (ref <10)

## 2019-01-12 MED ORDER — SODIUM CHLORIDE 0.9 % IV BOLUS
1000.0000 mL | Freq: Once | INTRAVENOUS | Status: AC
  Administered 2019-01-12: 1000 mL via INTRAVENOUS

## 2019-01-12 MED ORDER — ACETAMINOPHEN 325 MG PO TABS
650.0000 mg | ORAL_TABLET | Freq: Four times a day (QID) | ORAL | Status: DC | PRN
  Administered 2019-01-12 – 2019-01-13 (×3): 650 mg via ORAL
  Filled 2019-01-12 (×3): qty 2

## 2019-01-12 MED ORDER — HYDROCODONE-ACETAMINOPHEN 5-325 MG PO TABS
1.0000 | ORAL_TABLET | Freq: Four times a day (QID) | ORAL | Status: DC | PRN
  Administered 2019-01-13: 1 via ORAL
  Administered 2019-01-14: 2 via ORAL
  Filled 2019-01-12: qty 1
  Filled 2019-01-12: qty 2

## 2019-01-12 MED ORDER — TRAZODONE HCL 50 MG PO TABS
25.0000 mg | ORAL_TABLET | Freq: Every evening | ORAL | Status: DC | PRN
  Administered 2019-01-12 – 2019-01-13 (×2): 25 mg via ORAL
  Filled 2019-01-12 (×2): qty 1

## 2019-01-12 MED ORDER — ENOXAPARIN SODIUM 40 MG/0.4ML ~~LOC~~ SOLN
40.0000 mg | SUBCUTANEOUS | Status: DC
  Administered 2019-01-12 – 2019-01-13 (×2): 40 mg via SUBCUTANEOUS
  Filled 2019-01-12 (×2): qty 0.4

## 2019-01-12 MED ORDER — ONDANSETRON HCL 4 MG PO TABS: 4.0000 mg | ORAL_TABLET | Freq: Four times a day (QID) | ORAL | Status: DC | PRN

## 2019-01-12 MED ORDER — ONDANSETRON HCL 4 MG/2ML IJ SOLN
4.0000 mg | Freq: Four times a day (QID) | INTRAMUSCULAR | Status: DC | PRN
  Administered 2019-01-12 – 2019-01-14 (×4): 4 mg via INTRAVENOUS
  Filled 2019-01-12 (×4): qty 2

## 2019-01-12 MED ORDER — POTASSIUM CHLORIDE IN NACL 20-0.9 MEQ/L-% IV SOLN
INTRAVENOUS | Status: DC
  Administered 2019-01-12 – 2019-01-14 (×5): via INTRAVENOUS
  Filled 2019-01-12 (×9): qty 1000

## 2019-01-12 MED ORDER — MAGNESIUM HYDROXIDE 400 MG/5ML PO SUSP: 30.0000 mL | Freq: Every day | ORAL | Status: DC | PRN

## 2019-01-12 MED ORDER — ACETAMINOPHEN 650 MG RE SUPP: 650.0000 mg | Freq: Four times a day (QID) | RECTAL | Status: DC | PRN

## 2019-01-12 MED ORDER — PROMETHAZINE HCL 25 MG/ML IJ SOLN
12.5000 mg | Freq: Once | INTRAMUSCULAR | Status: AC
  Administered 2019-01-13: 12.5 mg via INTRAVENOUS
  Filled 2019-01-12: qty 1

## 2019-01-12 MED ORDER — MORPHINE SULFATE (PF) 2 MG/ML IV SOLN: 2.0000 mg | INTRAVENOUS | Status: DC | PRN

## 2019-01-12 NOTE — ED Notes (Signed)
Attempted to call report at this time 

## 2019-01-12 NOTE — ED Notes (Signed)
Red top resent

## 2019-01-12 NOTE — ED Provider Notes (Signed)
[facility] Emergency Department Provider Note    First MD Initiated Contact with Patient 01/12/19 1739     (approximate)  I have reviewed the triage vital signs and the nursing notes.   HISTORY  Chief Complaint Drug Overdose  Level V caveat:  Ams, - overdose  HPI Samuel Davila is a 28 y.o. male presents to the ER via EMS after overdosing on huffing "dust off "today.  Patient states it was recreational as he was trying to get high.  Brother-in-law called EMS the patient went unresponsive.  This is only for a few moments.  He was standing and awake when EMS found him.  States he still feels intoxicated.  Denies any chest pain.  No headache.  Does feel thirsty.  No witnessed seizure-like activity.    History reviewed. No pertinent past medical history. No family history on file. Past Surgical History:  Procedure Laterality Date   NO PAST SURGERIES     There are no active problems to display for this patient.     Prior to Admission medications   Medication Sig Start Date End Date Taking? Authorizing Provider  ondansetron (ZOFRAN-ODT) 4 MG disintegrating tablet Take 1-2 tablets (4-8 mg total) by mouth every 8 (eight) hours as needed for nausea or vomiting. 09/02/18   Everlene Otherook, Jayce G, DO  pantoprazole (PROTONIX) 20 MG tablet Take 2 tablets (40 mg total) by mouth daily. 09/02/18   Tommie Samsook, Jayce G, DO  potassium chloride SA (K-DUR) 20 MEQ tablet Take 1 tablet (20 mEq total) by mouth 2 (two) times daily for 3 days. 09/02/18 09/05/18  Tommie Samsook, Jayce G, DO  promethazine (PHENERGAN) 25 MG tablet Take 1 tablet (25 mg total) by mouth every 8 (eight) hours as needed for nausea, vomiting or refractory nausea / vomiting. 09/02/18   Tommie Samsook, Jayce G, DO    Allergies Patient has no known allergies.    Social History Social History   Tobacco Use   Smoking status: Current Every Day Smoker    Packs/day: 1.00    Types: Cigarettes   Smokeless tobacco: Never Used  Substance  Use Topics   Alcohol use: Yes    Comment: occasionally   Drug use: Yes    Types: Marijuana    Review of Systems Patient denies headaches, rhinorrhea, blurry vision, numbness, shortness of breath, chest pain, edema, cough, abdominal pain, nausea, vomiting, diarrhea, dysuria, fevers, rashes or hallucinations unless otherwise stated above in HPI. ____________________________________________   PHYSICAL EXAM:  VITAL SIGNS: Vitals:   01/12/19 1812 01/12/19 1830  BP: 136/87 124/71  Pulse: (!) 108 99  Resp: (!) 26 19  Temp:    SpO2: 98% 97%    Constitutional: Alert, blood shot eyes, anxious appearing but cooperative  Eyes: Conjunctivae are normal.  Head: Atraumatic. Nose: No congestion/rhinnorhea. Mouth/Throat: Mucous membranes are moist.   Neck: No stridor. Painless ROM.  Cardiovascular: Normal rate, regular rhythm. Grossly normal heart sounds.  Good peripheral circulation. Respiratory: Normal respiratory effort.  No retractions. Lungs CTAB. Gastrointestinal: Soft and nontender. No distention. No abdominal bruits. No CVA tenderness. Genitourinary:  Musculoskeletal: No lower extremity tenderness nor edema.  No joint effusions. Neurologic:  Normal speech and language. No gross focal neurologic deficits are appreciated. No facial droop Skin:  Skin is warm, dry and intact. No rash noted. Psychiatric: Mood and affect anxious,  Speech and behavior are normal.  ____________________________________________   LABS (all labs ordered are listed, but only abnormal results are displayed)  Results for orders  placed or performed during the hospital encounter of 01/12/19 (from the past 24 hour(s))  Comprehensive metabolic panel     Status: Abnormal   Collection Time: 01/12/19  5:46 PM  Result Value Ref Range   Sodium 131 (L) 135 - 145 mmol/L   Potassium 3.2 (L) 3.5 - 5.1 mmol/L   Chloride 92 (L) 98 - 111 mmol/L   CO2 21 (L) 22 - 32 mmol/L   Glucose, Bld 151 (H) 70 - 99 mg/dL   BUN 34  (H) 6 - 20 mg/dL   Creatinine, Ser 1.61 (H) 0.61 - 1.24 mg/dL   Calcium 8.9 8.9 - 09.6 mg/dL   Total Protein 8.9 (H) 6.5 - 8.1 g/dL   Albumin 4.5 3.5 - 5.0 g/dL   AST 51 (H) 15 - 41 U/L   ALT 39 0 - 44 U/L   Alkaline Phosphatase 85 38 - 126 U/L   Total Bilirubin 0.9 0.3 - 1.2 mg/dL   GFR calc non Af Amer 24 (L) >60 mL/min   GFR calc Af Amer 28 (L) >60 mL/min   Anion gap 18 (H) 5 - 15  cbc     Status: Abnormal   Collection Time: 01/12/19  5:46 PM  Result Value Ref Range   WBC 13.1 (H) 4.0 - 10.5 K/uL   RBC 5.17 4.22 - 5.81 MIL/uL   Hemoglobin 15.2 13.0 - 17.0 g/dL   HCT 04.5 40.9 - 81.1 %   MCV 87.0 80.0 - 100.0 fL   MCH 29.4 26.0 - 34.0 pg   MCHC 33.8 30.0 - 36.0 g/dL   RDW 91.4 78.2 - 95.6 %   Platelets 313 150 - 400 K/uL   nRBC 0.0 0.0 - 0.2 %  Magnesium     Status: Abnormal   Collection Time: 01/12/19  5:46 PM  Result Value Ref Range   Magnesium 2.7 (H) 1.7 - 2.4 mg/dL  Urine Drug Screen, Qualitative ([facility] only)     Status: None   Collection Time: 01/12/19  6:15 PM  Result Value Ref Range   Tricyclic, Ur Screen NONE DETECTED NONE DETECTED   Amphetamines, Ur Screen NONE DETECTED NONE DETECTED   MDMA (Ecstasy)Ur Screen NONE DETECTED NONE DETECTED   Cocaine Metabolite,Ur [place] NONE DETECTED NONE DETECTED   Opiate, Ur Screen NONE DETECTED NONE DETECTED   Phencyclidine (PCP) Ur S NONE DETECTED NONE DETECTED   Cannabinoid 50 Ng, Ur [place] NONE DETECTED NONE DETECTED   Barbiturates, Ur Screen NONE DETECTED NONE DETECTED   Benzodiazepine, Ur Scrn NONE DETECTED NONE DETECTED   Methadone Scn, Ur NONE DETECTED NONE DETECTED  Ethanol     Status: None   Collection Time: 01/12/19  6:34 PM  Result Value Ref Range   Alcohol, Ethyl (B) <10 <10 mg/dL   ____________________________________________  EKG My review and personal interpretation at Time: 17:15 Indication: overdose  Rate: 115  Rhythm: sinus Axis: normal  Other: normal intervals, no  stemi ____________________________________________  RADIOLOGY  I personally reviewed all radiographic images ordered to evaluate for the above acute complaints and reviewed radiology reports and findings.  These findings were personally discussed with the patient.  Please see medical record for radiology report.  ____________________________________________   PROCEDURES  Procedure(s) performed:  .Critical Care Performed by: Willy Eddy, MD Authorized by: Willy Eddy, MD   Critical care provider statement:    Critical care time (minutes):  30   Critical care time was exclusive of:  Separately billable procedures and treating other patients   Critical  care was necessary to treat or prevent imminent or life-threatening deterioration of the following conditions:  Renal failure   Critical care was time spent personally by me on the following activities:  Development of treatment plan with patient or surrogate, discussions with consultants, evaluation of patient's response to treatment, examination of patient, obtaining history from patient or surrogate, ordering and performing treatments and interventions, ordering and review of laboratory studies, ordering and review of radiographic studies, pulse oximetry, re-evaluation of patient's condition and review of old charts      Critical Care performed: yes ____________________________________________   INITIAL IMPRESSION / ASSESSMENT AND PLAN / ED COURSE  Pertinent labs & imaging results that were available during my care of the patient were reviewed by me and considered in my medical decision making (see chart for details).   DDX: Overdose, AKI, electrolyte abnormality, dysrhythmia, seizure  Samuel Davila is a 28 y.o. who presents to the ED with recreational overdose as described above.  Patient intoxicated appearing but protecting his airway.  No acute distress.  Does appear anxious but cooperative with exam.  He is  tachycardic EKG shows no excitation syndrome with normal intervals.  Will give IV fluids check blood work and reassess.  Poison control consulted.  Clinical Course as of Jan 12 1908  Sun Jan 12, 2019  1845 Patient reassessed.  Clinically seems to be improving.  Feels very thirsty.  Receiving IV fluids.  Repeat abdominal exam is soft and benign.  Blood work does show evidence of acute renal failure.  Patient now admits that he is been doing this throughout the weekend.  Denies any other ingestion.   [PR]    Clinical Course User Index [PR] Merlyn Lot, MD    The patient was evaluated in Emergency Department today for the symptoms described in the history of present illness. He/she was evaluated in the context of the global COVID-19 pandemic, which necessitated consideration that the patient might be at risk for infection with the SARS-CoV-2 virus that causes COVID-19. Institutional protocols and algorithms that pertain to the evaluation of patients at risk for COVID-19 are in a state of rapid change based on information released by regulatory bodies including the CDC and federal and state organizations. These policies and algorithms were followed during the patient's care in the ED.  As part of my medical decision making, I reviewed the following data within the [place] notes reviewed and incorporated, Labs reviewed, notes from prior ED visits and [place] Controlled Substance Database   ____________________________________________   FINAL CLINICAL IMPRESSION(S) / ED DIAGNOSES  Final diagnoses:  Accidental drug overdose, initial encounter  Acute renal failure, unspecified acute renal failure type ([place])      NEW MEDICATIONS STARTED DURING THIS VISIT:  New Prescriptions   No medications on file     Note:  This document was prepared using Dragon voice recognition software and may include unintentional dictation errors.    Merlyn Lot, MD 01/12/19  Pauline Aus

## 2019-01-12 NOTE — ED Notes (Signed)
Pt c/o nausea Pt is A&O x4

## 2019-01-12 NOTE — ED Notes (Signed)
Pt given ice chips at thsi time/

## 2019-01-12 NOTE — ED Notes (Signed)
Pt restin gin bed, with wife at bedside, pt and family updated on plan of care. Pt denies any needs at this time. Pt states his back hurts, pt has chronic back pain, Pain rated 8/10 by pt

## 2019-01-12 NOTE — Plan of Care (Signed)
  Problem: Education: Goal: Knowledge of General Education information will improve Description: Including pain rating scale, medication(s)/side effects and non-pharmacologic comfort measures Outcome: Progressing   Problem: Clinical Measurements: Goal: Respiratory complications will improve Outcome: Progressing Goal: Cardiovascular complication will be avoided Outcome: Progressing   Problem: Education: Goal: Knowledge of disease or condition will improve Outcome: Progressing Note: Discuss physical harms of substance abuse

## 2019-01-12 NOTE — H&P (Signed)
[place] at [facility]   PATIENT NAME: Samuel Davila    MR#:  [id number]  DATE OF BIRTH:  22-Sep-1990  DATE OF ADMISSION:  01/12/2019  PRIMARY CARE PHYSICIAN: Patient, No Pcp Per   REQUESTING/REFERRING PHYSICIAN: Willy Eddy, MD CHIEF COMPLAINT:   Chief Complaint  Patient presents with  . Drug Overdose    HISTORY OF PRESENT ILLNESS:  Samuel Davila  is a 28 y.o. obese Caucasian male with a known history of depression and anxiety, who presented to the emergency room with acute onset of overdose on "dust off" that contains toxic Difluroethane.  The patient had been inhaling several bottles over the weekend from Thursday.  He stated that he was trying to get high.  He was started on p.o. Lexapro for depression and anxiety about a month ago.  He has not experimented with such inhalation of dust off since high school.  He had recurrent nausea and vomiting throughout the weekend as well as several syncopal episodes and spotty amnesia per his report.  He was found unresponsive today by his brother-in-law who called EMS.  The patient denied any chest pain or palpitations.  He admitted to chills but has not had any fever.  He has been having low back pain.  He denied any bleeding diathesis.  He has been having diminished urine output without dysuria, urinary frequency or urgency or hematuria.  He stated that he has been feeling foggy.  No cough or wheezing or recent sick exposures to COVID-19.  Upon presentation to the emergency room, blood pressure was 145/100 with a pulse of 119 with respirate of 23 and pulse oximetry 96% on room air.  Temperature was 99.2.  Labs were remarkable for hyponatremia 131 and hypochloremia of 92, hypokalemia of 3.2, elevated BUN of 34 and creatinine 3.3 with no previous history for chronic kidney disease and normal renal functions in July of this year.  CBC showed leukocytosis of 13.1.  Tylenol levels less than 10 and salicylate less than 7 and alcohol levels  less than 10.  Urine drug screen came back negative.  EKG showed sinus tachycardia with a rate of 117.  The patient was given 1 L bolus of IV normal saline.  Poison control was contacted.  Recommendation was for 24-hour observation for arrhythmias and avoiding amiodarone for ventricular fibrillation or ventricular tachycardia.  He will be admitted to telemetry observation bed for further evaluation and monitoring.  PAST MEDICAL HISTORY:  Depression and anxiety  PAST SURGICAL HISTORY:   Past Surgical History:  Procedure Laterality Date  . NO PAST SURGERIES    He denies any previous surgeries. SOCIAL HISTORY:   Social History   Tobacco Use  . Smoking status: Current Every Day Smoker    Packs/day: 1.00    Types: Cigarettes  . Smokeless tobacco: Never Used  Substance Use Topics  . Alcohol use: Yes    Comment: occasionally    FAMILY HISTORY:  No family history on file.  DRUG ALLERGIES:  No Known Allergies  REVIEW OF SYSTEMS:   ROS As per history of present illness. All pertinent systems were reviewed above. Constitutional,  HEENT, cardiovascular, respiratory, GI, GU, musculoskeletal, neuro, psychiatric, endocrine,  integumentary and hematologic systems were reviewed and are otherwise  negative/unremarkable except for positive findings mentioned above in the HPI.   MEDICATIONS AT HOME:   Prior to Admission medications   Medication Sig Start Date End Date Taking? Authorizing Provider  ondansetron (ZOFRAN-ODT) 4 MG disintegrating tablet Take 1-2  tablets (4-8 mg total) by mouth every 8 (eight) hours as needed for nausea or vomiting. Patient not taking: Reported on 01/12/2019 09/02/18   Tommie Samsook, Jayce G, DO  pantoprazole (PROTONIX) 20 MG tablet Take 2 tablets (40 mg total) by mouth daily. Patient not taking: Reported on 01/12/2019 09/02/18   Tommie Samsook, Jayce G, DO  potassium chloride SA (K-DUR) 20 MEQ tablet Take 1 tablet (20 mEq total) by mouth 2 (two) times daily for 3 days. 09/02/18  09/05/18  Tommie Samsook, Jayce G, DO  promethazine (PHENERGAN) 25 MG tablet Take 1 tablet (25 mg total) by mouth every 8 (eight) hours as needed for nausea, vomiting or refractory nausea / vomiting. Patient not taking: Reported on 01/12/2019 09/02/18   Tommie Samsook, Jayce G, DO   Lexapro 1 p.o. daily.  He is not sure of the dose.  VITAL SIGNS:  Blood pressure 138/83, pulse 85, temperature 99.2 F (37.3 C), temperature source Oral, resp. rate 20, height 5\' 11"  (1.803 m), weight 115.7 kg, SpO2 98 %.  PHYSICAL EXAMINATION:  Physical Exam  GENERAL:  28 y.o.-year-old patient lying in the bed with no acute distress.  EYES: Pupils equal, round, reactive to light and accommodation. No scleral icterus. Extraocular muscles intact.  HEENT: Head atraumatic, normocephalic. Oropharynx with slightly dry tongue and moist mucous membrane.  And nasopharynx clear.  NECK:  Supple, no jugular venous distention. No thyroid enlargement, no tenderness.  LUNGS: Normal breath sounds bilaterally, no wheezing, rales,rhonchi or crepitation. No use of accessory muscles of respiration.  CARDIOVASCULAR: Regular rate and rhythm, S1, S2 normal. No murmurs, rubs, or gallops.  ABDOMEN: Soft, nondistended, nontender. Bowel sounds present. No organomegaly or mass.  EXTREMITIES: No pedal edema, cyanosis, or clubbing.  NEUROLOGIC: Cranial nerves II through XII are intact. Muscle strength 5/5 in all extremities. Sensation intact. Gait not checked. Musculoskeletal: He had mild left lower paraspinal tenderness PSYCHIATRIC: The patient is alert and oriented x 3.  Normal affect and good eye contact. SKIN: No obvious rash, lesion, or ulcer.   LABORATORY PANEL:   CBC Recent Labs  Lab 01/12/19 1746  WBC 13.1*  HGB 15.2  HCT 45.0  PLT 313   ------------------------------------------------------------------------------------------------------------------  Chemistries  Recent Labs  Lab 01/12/19 1746  NA 131*  K 3.2*  CL 92*  CO2 21*   GLUCOSE 151*  BUN 34*  CREATININE 3.30*  CALCIUM 8.9  MG 2.7*  AST 51*  ALT 39  ALKPHOS 85  BILITOT 0.9   ------------------------------------------------------------------------------------------------------------------  Cardiac Enzymes No results for input(s): TROPONINI in the last 168 hours. ------------------------------------------------------------------------------------------------------------------  RADIOLOGY:  Dg Chest Portable 1 View  Result Date: 01/12/2019 CLINICAL DATA:  Overdose EXAM: PORTABLE CHEST 1 VIEW COMPARISON:  Portable exam 1827 hours without priors for comparison FINDINGS: Normal heart size, mediastinal contours, and pulmonary vascularity. Mild central peribronchial thickening. Question 2 calcified granulomata versus superimposed artifacts at LEFT base. No definite infiltrate, pleural effusion or pneumothorax. Bones unremarkable. IMPRESSION: Bronchitic changes without infiltrate. Electronically Signed   By: Ulyses SouthwardMark  Boles M.D.   On: 01/12/2019 19:11      IMPRESSION AND PLAN:   1.  Acute kidney injury secondary to recreational accidental overdose of difluoroethane as well as intractable nausea and vomiting with subsequent volume depletion and dehydration.  The patient will be admitted to an observation telemetry bed.  We will follow BMP with hydration with IV normal saline.  2.  Intractable nausea and vomiting.  He will be hydrated with IV normal saline and will be provided as needed antiemetics.  3.  Recurrent syncope and unresponsiveness.  I suspect that this could be related to neurally mediated syncope from recurrent nausea and vomiting and toxic effect of substance inhaled.  Arrhythmia could be in the differential diagnosis of his syncope.  He will be monitored for arrhythmias on telemetry.  We will avoid IV amiodarone per poison control condition for any arrhythmias.     4.  Depression and anxiety.  We will continue his Lexapro and obtain a psychiatry  consultation for further assessment.  5.  Ongoing tobacco abuse.  I counseled him for smoking cessation and he will receive further counseling here.  6.  DVT prophylaxis.  Subcutaneous Lovenox.   All the records are reviewed and case discussed with ED provider. The plan of care was discussed in details with the patient (and family). I answered all questions. The patient agreed to proceed with the above mentioned plan. Further management will depend upon hospital course.   CODE STATUS: Full code  TOTAL TIME TAKING CARE OF THIS PATIENT: 55 minutes.    Christel Mormon M.D on 01/12/2019 at 7:39 PM  [facility]   From 7 PM-7 AM, contact night-coverage [email or web address]  CC: Primary care physician; Patient, No Pcp Per   Note: This dictation was prepared with Dragon dictation along with smaller phrase technology. Any transcriptional errors that result from this process are unintentional.

## 2019-01-12 NOTE — ED Notes (Signed)
Pt given ice chips per Dr Quentin Cornwall

## 2019-01-12 NOTE — ED Notes (Signed)
Charge RN Theadora Rama spoke with poison control they recommended EKG initial and repeat 6 hours, CMP, Mag, CXR - they stated that pt could have arrhythmias and that if this occurs not to give "standard ACLS meds" because they could be fatal specifically no Amnio or epi, and to monitor for 12-24 hours - Dr Quentin Cornwall made aware - he requested poison control be called back to obtain information/guidance on what to do if arrhythmia - poison control stated that if this occurred the doctor would need to call them back for emergency consult with toxicologist and  Guidance would be given at that time

## 2019-01-12 NOTE — ED Notes (Signed)
Lab called and stated red top hemolyzed and needs to be recollected

## 2019-01-12 NOTE — ED Triage Notes (Signed)
Pt arrived via EMS from home for report of overdose - pt reports that he has been "huffing dust off" all day today to get high without result - he reports that he feels "strange" but denies pain - denies SI

## 2019-01-13 DIAGNOSIS — T40901A Poisoning by unspecified psychodysleptics [hallucinogens], accidental (unintentional), initial encounter: Secondary | ICD-10-CM

## 2019-01-13 DIAGNOSIS — T50901A Poisoning by unspecified drugs, medicaments and biological substances, accidental (unintentional), initial encounter: Secondary | ICD-10-CM

## 2019-01-13 LAB — BASIC METABOLIC PANEL
Anion gap: 13 (ref 5–15)
BUN: 31 mg/dL — ABNORMAL HIGH (ref 6–20)
CO2: 23 mmol/L (ref 22–32)
Calcium: 9 mg/dL (ref 8.9–10.3)
Chloride: 99 mmol/L (ref 98–111)
Creatinine, Ser: 2.8 mg/dL — ABNORMAL HIGH (ref 0.61–1.24)
GFR calc Af Amer: 34 mL/min — ABNORMAL LOW (ref >60)
GFR calc non Af Amer: 29 mL/min — ABNORMAL LOW (ref >60)
Glucose, Bld: 107 mg/dL — ABNORMAL HIGH (ref 70–99)
Potassium: 3.3 mmol/L — ABNORMAL LOW (ref 3.5–5.1)
Sodium: 135 mmol/L (ref 135–145)

## 2019-01-13 LAB — CBC
HCT: 42.3 % (ref 39.0–52.0)
Hemoglobin: 14.8 g/dL (ref 13.0–17.0)
MCH: 29.7 pg (ref 26.0–34.0)
MCHC: 35 g/dL (ref 30.0–36.0)
MCV: 84.9 fL (ref 80.0–100.0)
Platelets: 308 10*3/uL (ref 150–400)
RBC: 4.98 MIL/uL (ref 4.22–5.81)
RDW: 12.8 % (ref 11.5–15.5)
WBC: 11 10*3/uL — ABNORMAL HIGH (ref 4.0–10.5)
nRBC: 0 % (ref 0.0–0.2)

## 2019-01-13 LAB — SARS CORONAVIRUS 2 (TAT 6-24 HRS): SARS Coronavirus 2: NEGATIVE

## 2019-01-13 LAB — HIV ANTIBODY (ROUTINE TESTING W REFLEX): HIV Screen 4th Generation wRfx: NONREACTIVE

## 2019-01-13 MED ORDER — ESCITALOPRAM OXALATE 10 MG PO TABS
10.0000 mg | ORAL_TABLET | Freq: Every day | ORAL | Status: DC
  Administered 2019-01-14: 10 mg via ORAL
  Filled 2019-01-13: qty 1

## 2019-01-13 MED ORDER — PROMETHAZINE HCL 25 MG/ML IJ SOLN
12.5000 mg | Freq: Three times a day (TID) | INTRAMUSCULAR | Status: DC | PRN
  Administered 2019-01-13 – 2019-01-14 (×4): 12.5 mg via INTRAVENOUS
  Filled 2019-01-13 (×4): qty 1

## 2019-01-13 NOTE — Consult Note (Signed)
[facility] Face-to-Face Psychiatry Consult   Reason for Consult: Accidental overdose Referring Physician:  Dr. Fanny Bien Patient Identification: Samuel Davila MRN:  [id number] Principal Diagnosis: <principal problem not specified> Diagnosis:  Active Problems:   AKI (acute kidney injury) (HCC)   Total Time spent with patient: 45 minutes  Subjective:   Samuel Davila is a 28 y.o. male patient admitted with a nausea and vomiting following an accidental overdose of dust off inhalant.  HPI: Patient is a 28 year old male with a history of anxiety and depression who presents following accidental overdose of dust off inhaling.  Patient states that this was an attempt to get high, not an attempt to end his life.  Patient reports not having tried inhalants since high school but has tried some this weekend for unknown reasons.  Patient states he is not sure what prompted him to try it again but feels remorseful for doing so.  Patient states that he was using a couple cans of the inhalant and remains unsure as to the exact timeline of events.  Patient states that he woke up with an ambulance there bring him to the hospital which made him realize that his use was no longer fine and has begun to be problematic.  Patient denies that he has been using dust off chronically.  He denies other major substance use aside from marijuana which he uses approximately several times per week.  Patient does cite some stressors including financial, and recent loss plus increased stress due to the pandemic. Patient denies any current suicidal or homicidal ideation.  He denies any hallucinations or psychotic experiences.  Patient denies mania. Past Psychiatric History: Patient recently started on Lexapro approximately 2 months prior due to feelings of depression anxiety. Denies any previous suicide attempts.  Denies any previous psychiatric hospitalizations.  Denies any previous manic episodes. Risk to Self:  No  Risk to Others:   No Prior Inpatient Therapy:  No Prior Outpatient Therapy:  Yes  Past Medical History: History reviewed. No pertinent past medical history.  Past Surgical History:  Procedure Laterality Date  . NO PAST SURGERIES     Family History: History reviewed. No pertinent family history. Family Psychiatric  History: Reports mother takes antidepressants. Social History:  Social History   Substance and Sexual Activity  Alcohol Use Yes   Comment: occasionally     Social History   Substance and Sexual Activity  Drug Use Yes  . Types: Marijuana    Social History   Socioeconomic History  . Marital status: Married    Spouse name: Not on file  . Number of children: Not on file  . Years of education: Not on file  . Highest education level: Not on file  Occupational History  . Not on file  Social Needs  . Financial resource strain: Not on file  . Food insecurity    Worry: Not on file    Inability: Not on file  . Transportation needs    Medical: Not on file    Non-medical: Not on file  Tobacco Use  . Smoking status: Current Every Day Smoker    Packs/day: 1.00    Types: Cigarettes  . Smokeless tobacco: Never Used  Substance and Sexual Activity  . Alcohol use: Yes    Comment: occasionally  . Drug use: Yes    Types: Marijuana  . Sexual activity: Not on file  Lifestyle  . Physical activity    Days per week: Not on file    Minutes per  session: Not on file  . Stress: Not on file  Relationships  . Social Musicianconnections    Talks on phone: Not on file    Gets together: Not on file    Attends religious service: Not on file    Active member of club or organization: Not on file    Attends meetings of clubs or organizations: Not on file    Relationship status: Not on file  Other Topics Concern  . Not on file  Social History Narrative  . Not on file   Additional Social History: Patient lives with his wife whom is his high school sweetheart.  Patient lives 4 doors down from his sister  who is a close social support for him.  Patient currently working as an Nutritional therapistinsurance salesman.    Allergies:  No Known Allergies  Labs:  Results for orders placed or performed during the hospital encounter of 01/12/19 (from the past 48 hour(s))  Comprehensive metabolic panel     Status: Abnormal   Collection Time: 01/12/19  5:46 PM  Result Value Ref Range   Sodium 131 (L) 135 - 145 mmol/L   Potassium 3.2 (L) 3.5 - 5.1 mmol/L   Chloride 92 (L) 98 - 111 mmol/L   CO2 21 (L) 22 - 32 mmol/L   Glucose, Bld 151 (H) 70 - 99 mg/dL   BUN 34 (H) 6 - 20 mg/dL   Creatinine, Ser 1.613.30 (H) 0.61 - 1.24 mg/dL   Calcium 8.9 8.9 - 09.610.3 mg/dL   Total Protein 8.9 (H) 6.5 - 8.1 g/dL   Albumin 4.5 3.5 - 5.0 g/dL   AST 51 (H) 15 - 41 U/L   ALT 39 0 - 44 U/L   Alkaline Phosphatase 85 38 - 126 U/L   Total Bilirubin 0.9 0.3 - 1.2 mg/dL   GFR calc non Af Amer 24 (L) >60 mL/min   GFR calc Af Amer 28 (L) >60 mL/min   Anion gap 18 (H) 5 - 15    Comment: Performed at [facility], [address] [place], [place] [address]  cbc     Status: Abnormal   Collection Time: 01/12/19  5:46 PM  Result Value Ref Range   WBC 13.1 (H) 4.0 - 10.5 K/uL   RBC 5.17 4.22 - 5.81 MIL/uL   Hemoglobin 15.2 13.0 - 17.0 g/dL   HCT 98.145.0 19.139.0 - 47.852.0 %   MCV 87.0 80.0 - 100.0 fL   MCH 29.4 26.0 - 34.0 pg   MCHC 33.8 30.0 - 36.0 g/dL   RDW 29.512.7 62.111.5 - 30.815.5 %   Platelets 313 150 - 400 K/uL   nRBC 0.0 0.0 - 0.2 %    Comment: Performed at [facility], [address] [place], [place] [address]  Magnesium     Status: Abnormal   Collection Time: 01/12/19  5:46 PM  Result Value Ref Range   Magnesium 2.7 (H) 1.7 - 2.4 mg/dL    Comment: Performed at [facility], [address] [place], [place] [address]  CK     Status: Abnormal   Collection Time: 01/12/19  5:46 PM  Result Value Ref Range   Total CK 641 (H) 49 - 397 U/L    Comment: Performed at [facility], [address] [place], [place]  [id number]  Urine Drug Screen, Qualitative ([facility] only)     Status: None   Collection Time: 01/12/19  6:15 PM  Result Value Ref Range   Tricyclic, Ur Screen  NONE DETECTED NONE DETECTED   Amphetamines, Ur Screen NONE DETECTED NONE DETECTED   MDMA (Ecstasy)Ur Screen NONE DETECTED NONE DETECTED   Cocaine Metabolite,Ur [place] NONE DETECTED NONE DETECTED   Opiate, Ur Screen NONE DETECTED NONE DETECTED   Phencyclidine (PCP) Ur S NONE DETECTED NONE DETECTED   Cannabinoid 50 Ng, Ur [place] NONE DETECTED NONE DETECTED   Barbiturates, Ur Screen NONE DETECTED NONE DETECTED   Benzodiazepine, Ur Scrn NONE DETECTED NONE DETECTED   Methadone Scn, Ur NONE DETECTED NONE DETECTED    Comment: (NOTE) Tricyclics + metabolites, urine    Cutoff 1000 ng/mL Amphetamines + metabolites, urine  Cutoff 1000 ng/mL MDMA (Ecstasy), urine              Cutoff 500 ng/mL Cocaine Metabolite, urine          Cutoff 300 ng/mL Opiate + metabolites, urine        Cutoff 300 ng/mL Phencyclidine (PCP), urine         Cutoff 25 ng/mL Cannabinoid, urine                 Cutoff 50 ng/mL Barbiturates + metabolites, urine  Cutoff 200 ng/mL Benzodiazepine, urine              Cutoff 200 ng/mL Methadone, urine                   Cutoff 300 ng/mL The urine drug screen provides only a preliminary, unconfirmed analytical test result and should not be used for non-medical purposes. Clinical consideration and professional judgment should be applied to any positive drug screen result due to possible interfering substances. A more specific alternate chemical method must be used in order to obtain a confirmed analytical result. Gas chromatography / mass spectrometry (GC/MS) is the preferred confirmat ory method. Performed at [facility], [address] [place], [place] [address]   Acetaminophen level     Status: Abnormal   Collection Time: 01/12/19  6:34 PM  Result Value Ref Range   Acetaminophen (Tylenol), Serum <10 (L) 10 - 30 ug/mL     Comment: (NOTE) Therapeutic concentrations vary significantly. A range of 10-30 ug/mL  may be an effective concentration for many patients. However, some  are best treated at concentrations outside of this range. Acetaminophen concentrations >150 ug/mL at 4 hours after ingestion  and >50 ug/mL at 12 hours after ingestion are often associated with  toxic reactions. Performed at [facility], [address] [place], [place] [address]   Ethanol     Status: None   Collection Time: 01/12/19  6:34 PM  Result Value Ref Range   Alcohol, Ethyl (B) <10 <10 mg/dL    Comment: (NOTE) Lowest detectable limit for serum alcohol is 10 mg/dL. For medical purposes only. Performed at [facility], [address] [place], [place] [address]   Salicylate level     Status: None   Collection Time: 01/12/19  6:34 PM  Result Value Ref Range   Salicylate Lvl <9.1 2.8 - 30.0 mg/dL    Comment: Performed at [facility], [place], [place] [address]  SARS CORONAVIRUS 2 (TAT 6-24 HRS) Nasopharyngeal Nasopharyngeal Swab     Status: None   Collection Time: 01/12/19  7:16 PM   Specimen: Nasopharyngeal Swab  Result Value Ref Range   SARS Coronavirus 2 NEGATIVE NEGATIVE    Comment: (NOTE) SARS-CoV-2 target nucleic acids are NOT DETECTED. The SARS-CoV-2 RNA is generally detectable in upper and  lower respiratory specimens during the acute phase of infection. Negative results do not preclude SARS-CoV-2 infection, do not rule out co-infections with other pathogens, and should not be used as the sole basis for treatment or other patient management decisions. Negative results must be combined with clinical observations, patient history, and epidemiological information. The expected result is Negative. Fact Sheet for Patients: HairSlick.no Fact Sheet for Healthcare Providers: quierodirigir.com This test is not yet  approved or cleared by the [place] FDA and  has been authorized for detection and/or diagnosis of SARS-CoV-2 by FDA under an Emergency Use Authorization (EUA). This EUA will remain  in effect (meaning this test can be used) for the duration of the COVID-19 declaration under Section 56 4(b)(1) of the Act, 21 U.S.C. section 360bbb-3(b)(1), unless the authorization is terminated or revoked sooner. Performed at [facility], [address] [place], [place] [address]   Basic metabolic panel     Status: Abnormal   Collection Time: 01/13/19  6:12 AM  Result Value Ref Range   Sodium 135 135 - 145 mmol/L   Potassium 3.3 (L) 3.5 - 5.1 mmol/L   Chloride 99 98 - 111 mmol/L   CO2 23 22 - 32 mmol/L   Glucose, Bld 107 (H) 70 - 99 mg/dL   BUN 31 (H) 6 - 20 mg/dL   Creatinine, Ser 6.04 (H) 0.61 - 1.24 mg/dL   Calcium 9.0 8.9 - 54.0 mg/dL   GFR calc non Af Amer 29 (L) >60 mL/min   GFR calc Af Amer 34 (L) >60 mL/min   Anion gap 13 5 - 15    Comment: Performed at [facility], [address] [place], [place] [address]  CBC     Status: Abnormal   Collection Time: 01/13/19  6:12 AM  Result Value Ref Range   WBC 11.0 (H) 4.0 - 10.5 K/uL   RBC 4.98 4.22 - 5.81 MIL/uL   Hemoglobin 14.8 13.0 - 17.0 g/dL   HCT 14.7 82.9 - 56.2 %   MCV 84.9 80.0 - 100.0 fL   MCH 29.7 26.0 - 34.0 pg   MCHC 35.0 30.0 - 36.0 g/dL   RDW 13.0 86.5 - 78.4 %   Platelets 308 150 - 400 K/uL   nRBC 0.0 0.0 - 0.2 %    Comment: Performed at [facility], [address] [place], [place] [address]  HIV Antibody (routine testing w rflx)     Status: None   Collection Time: 01/13/19  6:12 AM  Result Value Ref Range   HIV Screen 4th Generation wRfx NON REACTIVE NON REACTIVE    Comment: Performed at [facility], [address] [place], [place] [address]    Current Facility-Administered Medications  Medication Dose Route Frequency Provider Last Rate Last Dose  . 0.9 % NaCl with KCl 20 mEq/  L  infusion   Intravenous Continuous Mansy, Jan A, MD 125 mL/hr at 01/13/19 1400    . acetaminophen (TYLENOL) tablet 650 mg  650 mg Oral Q6H PRN Mansy, Jan A, MD   650 mg at 01/13/19 1638   Or  . acetaminophen (TYLENOL) suppository 650 mg  650 mg Rectal Q6H PRN Mansy, Jan A, MD      . enoxaparin (LOVENOX) injection 40 mg  40 mg Subcutaneous Q24H Mansy, Jan A, MD   40 mg at 01/12/19 2129  . HYDROcodone-acetaminophen (NORCO/VICODIN) 5-325 MG per tablet 1-2 tablet  1-2 tablet Oral Q6H PRN Bodenheimer, Bing Neighbors, NP  1 tablet at 01/13/19 1046  . magnesium hydroxide (MILK OF MAGNESIA) suspension 30 mL  30 mL Oral Daily PRN Mansy, Jan A, MD      . morphine 2 MG/ML injection 2 mg  2 mg Intravenous Q4H PRN Mansy, Jan A, MD      . ondansetron [facility]) tablet 4 mg  4 mg Oral Q6H PRN Mansy, Jan A, MD       Or  . ondansetron [facility]) injection 4 mg  4 mg Intravenous Q6H PRN Mansy, Jan A, MD   4 mg at 01/13/19 1634  . promethazine (PHENERGAN) injection 12.5 mg  12.5 mg Intravenous Q8H PRN Thomasenia Bottoms, MD   12.5 mg at 01/13/19 1105  . traZODone (DESYREL) tablet 25 mg  25 mg Oral QHS PRN Mansy, Jan A, MD   25 mg at 01/12/19 2127    Musculoskeletal: Strength & Muscle Tone: within normal limits Gait & Station: normal Patient leans: N/A  Psychiatric Specialty Exam: Physical Exam  Review of Systems  Constitutional: Negative for fever.  Eyes: Negative for blurred vision.  Gastrointestinal: Positive for nausea and vomiting. Negative for heartburn.  Endo/Heme/Allergies: Does not bruise/bleed easily.  Psychiatric/Behavioral: Positive for depression and substance abuse. Negative for hallucinations, memory loss and suicidal ideas. The patient is nervous/anxious. The patient does not have insomnia.     Blood pressure 120/69, pulse 77, temperature 98.9 F (37.2 C), temperature source Oral, resp. rate 18, height 5\' 11"  (1.803 m), weight 115.7 kg, SpO2 95 %.Body mass index is 35.57 kg/m.  General Appearance:  Casual  Eye Contact:  Fair  Speech:  Clear and Coherent  Volume:  Normal  Mood:  Depressed  Affect:  Congruent  Thought Process:  Coherent  Orientation:  Full (Time, Place, and Person)  Thought Content:  Logical  Suicidal Thoughts:  No  Homicidal Thoughts:  No  Memory:  Recent;   Fair  Judgement:  Intact  Insight:  Fair  Psychomotor Activity:  Normal  Concentration:  Concentration: Fair  Recall:  of Knowledge:  Fair  Language:  Fair  Akathisia:  No  Handed:  Right  AIMS (if indicated):     Assets:  Communication Skills Desire for Improvement Financial Resources/Insurance Housing Intimacy Physical Health Resilience Social Support Talents/Skills Transportation Vocational/Educational  ADL's:  Intact  Cognition:  WNL  Sleep:        Treatment Plan Summary: 28 year old male with history of anxiety and depression presenting with accidental overdose of dust off cans.  Patient at this time is remorseful and willing to engage in outpatient care.  Patient does have depressed mood, but is willing to receive the appropriate treatment and to follow-up on an outpatient basis.  Does not meet criteria for inpatient hospitalization.  Diagnosis: MDD, substance abuse   Will restart Lexapro at 10 mg p.o. every morning   Please have social work make referral to outpatient psychiatric care  Disposition: No evidence of imminent risk to self or others at present.   Patient does not meet criteria for psychiatric inpatient admission. Supportive therapy provided about ongoing stressors. Discussed crisis plan, support from social network, calling 911, coming to the Emergency Department, and calling Suicide Hotline.  37, MD 01/13/2019 4:55 PM

## 2019-01-13 NOTE — Progress Notes (Signed)
Nausea and vomiting resolved after receiving phenergan. Will continue to monitor and assess.

## 2019-01-13 NOTE — Progress Notes (Signed)
HPI on admission:  28 y.o. obese Caucasian male with a known history of depression and anxiety, who presented to the emergency room with acute onset of overdose on "dust off" that contains toxic Difluroethane.  The patient had been inhaling several bottles over the weekend from Thursday.  He stated that he was trying to get high.  He was started on p.o. Lexapro for depression and anxiety about a month ago.  He has not experimented with such inhalation of dust off since high school.  He had recurrent nausea and vomiting throughout the weekend as well as several syncopal episodes and spotty amnesia per his report.  He was found unresponsive today by his brother-in-law who called EMS.  The patient denied any chest pain or palpitations.  COVID-19 tested negative from ED.  Poison control was contacted.  Recommendation was for 24-hour observation for arrhythmias and avoiding amiodarone for ventricular fibrillation or ventricular tachycardia.  He will be admitted to telemetry observation bed for further evaluation and monitoring. Psych consulted.   Subjective: Complaining of nausea and vomiting says Zofran is not helping.  Patient denies any active suicidal thoughts or ideas, homicidal thoughts or ideas or plans.  Has not had breakfast because of the nausea vomiting.  Denies shortness of breath.  However says he was having some subjective fever.  Objective: Vital signs in last 24 hours: Temp:  [98.2 F (36.8 C)-99.2 F (37.3 C)] 99 F (37.2 C) (12/07 0934) Pulse Rate:  [85-119] 87 (12/07 0934) Resp:  [18-26] 18 (12/07 0934) BP: (124-152)/(71-100) 137/88 (12/07 0934) SpO2:  [95 %-100 %] 100 % (12/07 0934) Weight:  [115.7 kg] 115.7 kg (12/06 1743)  Intake/Output from previous day: 12/06 0701 - 12/07 0700 In: 1140.2 [I.V.:1140.2] Out: 2625 [Urine:2225] Intake/Output this shift: Total I/O In: -  Out: 900 [Urine:900]  GENERAL: Patient to be very tired.  Obese EYES: Pupils equal, round, reactive to  light and accommodation. No scleral icterus. Extraocular muscles intact.  HEENT: Head atraumatic, normocephalic. Oropharynx with slightly dry tongue and moist mucous membrane.  And nasopharynx clear.  NECK:  Supple, no jugular venous distention. No thyroid enlargement, no tenderness.  LUNGS: Normal breath sounds bilaterally, no wheezing, rales,rhonchi or crepitation. No use of accessory muscles of respiration.  CARDIOVASCULAR: Regular rate and rhythm, S1, S2 normal. No murmurs, rubs, or gallops.  ABDOMEN: Soft, nondistended, nontender. Bowel sounds present. No organomegaly or mass.  EXTREMITIES: No pedal edema, cyanosis, or clubbing.  NEUROLOGIC: Cranial nerves II through XII are intact. Muscle strength 5/5 in all extremities. Sensation intact. Gait not checked. Musculoskeletal: He had mild left lower paraspinal tenderness PSYCHIATRIC: The patient is alert and oriented x 3.  Normal affect and good eye contact. SKIN: No obvious rash, lesion, or ulcer.   Results for orders placed or performed during the hospital encounter of 01/12/19 (from the past 24 hour(s))  Comprehensive metabolic panel     Status: Abnormal   Collection Time: 01/12/19  5:46 PM  Result Value Ref Range   Sodium 131 (L) 135 - 145 mmol/L   Potassium 3.2 (L) 3.5 - 5.1 mmol/L   Chloride 92 (L) 98 - 111 mmol/L   CO2 21 (L) 22 - 32 mmol/L   Glucose, Bld 151 (H) 70 - 99 mg/dL   BUN 34 (H) 6 - 20 mg/dL   Creatinine, Ser 4.19 (H) 0.61 - 1.24 mg/dL   Calcium 8.9 8.9 - 37.9 mg/dL   Total Protein 8.9 (H) 6.5 - 8.1 g/dL   Albumin 4.5 3.5 -  5.0 g/dL   AST 51 (H) 15 - 41 U/L   ALT 39 0 - 44 U/L   Alkaline Phosphatase 85 38 - 126 U/L   Total Bilirubin 0.9 0.3 - 1.2 mg/dL   GFR calc non Af Amer 24 (L) >60 mL/min   GFR calc Af Amer 28 (L) >60 mL/min   Anion gap 18 (H) 5 - 15  cbc     Status: Abnormal   Collection Time: 01/12/19  5:46 PM  Result Value Ref Range   WBC 13.1 (H) 4.0 - 10.5 K/uL   RBC 5.17 4.22 - 5.81 MIL/uL    Hemoglobin 15.2 13.0 - 17.0 g/dL   HCT 30.8 65.7 - 84.6 %   MCV 87.0 80.0 - 100.0 fL   MCH 29.4 26.0 - 34.0 pg   MCHC 33.8 30.0 - 36.0 g/dL   RDW 96.2 95.2 - 84.1 %   Platelets 313 150 - 400 K/uL   nRBC 0.0 0.0 - 0.2 %  Magnesium     Status: Abnormal   Collection Time: 01/12/19  5:46 PM  Result Value Ref Range   Magnesium 2.7 (H) 1.7 - 2.4 mg/dL  CK     Status: Abnormal   Collection Time: 01/12/19  5:46 PM  Result Value Ref Range   Total CK 641 (H) 49 - 397 U/L  Urine Drug Screen, Qualitative ([facility] only)     Status: None   Collection Time: 01/12/19  6:15 PM  Result Value Ref Range   Tricyclic, Ur Screen NONE DETECTED NONE DETECTED   Amphetamines, Ur Screen NONE DETECTED NONE DETECTED   MDMA (Ecstasy)Ur Screen NONE DETECTED NONE DETECTED   Cocaine Metabolite,Ur [place] NONE DETECTED NONE DETECTED   Opiate, Ur Screen NONE DETECTED NONE DETECTED   Phencyclidine (PCP) Ur S NONE DETECTED NONE DETECTED   Cannabinoid 50 Ng, Ur [place] NONE DETECTED NONE DETECTED   Barbiturates, Ur Screen NONE DETECTED NONE DETECTED   Benzodiazepine, Ur Scrn NONE DETECTED NONE DETECTED   Methadone Scn, Ur NONE DETECTED NONE DETECTED  Acetaminophen level     Status: Abnormal   Collection Time: 01/12/19  6:34 PM  Result Value Ref Range   Acetaminophen (Tylenol), Serum <10 (L) 10 - 30 ug/mL  Ethanol     Status: None   Collection Time: 01/12/19  6:34 PM  Result Value Ref Range   Alcohol, Ethyl (B) <10 <10 mg/dL  Salicylate level     Status: None   Collection Time: 01/12/19  6:34 PM  Result Value Ref Range   Salicylate Lvl <7.0 2.8 - 30.0 mg/dL  SARS CORONAVIRUS 2 (TAT 6-24 HRS) Nasopharyngeal Nasopharyngeal Swab     Status: None   Collection Time: 01/12/19  7:16 PM   Specimen: Nasopharyngeal Swab  Result Value Ref Range   SARS Coronavirus 2 NEGATIVE NEGATIVE  Basic metabolic panel     Status: Abnormal   Collection Time: 01/13/19  6:12 AM  Result Value Ref Range   Sodium 135 135 - 145 mmol/L   Potassium  3.3 (L) 3.5 - 5.1 mmol/L   Chloride 99 98 - 111 mmol/L   CO2 23 22 - 32 mmol/L   Glucose, Bld 107 (H) 70 - 99 mg/dL   BUN 31 (H) 6 - 20 mg/dL   Creatinine, Ser 3.24 (H) 0.61 - 1.24 mg/dL   Calcium 9.0 8.9 - 40.1 mg/dL   GFR calc non Af Amer 29 (L) >60 mL/min   GFR calc Af Amer 34 (L) >60 mL/min  Anion gap 13 5 - 15  CBC     Status: Abnormal   Collection Time: 01/13/19  6:12 AM  Result Value Ref Range   WBC 11.0 (H) 4.0 - 10.5 K/uL   RBC 4.98 4.22 - 5.81 MIL/uL   Hemoglobin 14.8 13.0 - 17.0 g/dL   HCT 42.3 39.0 - 52.0 %   MCV 84.9 80.0 - 100.0 fL   MCH 29.7 26.0 - 34.0 pg   MCHC 35.0 30.0 - 36.0 g/dL   RDW 12.8 11.5 - 15.5 %   Platelets 308 150 - 400 K/uL   nRBC 0.0 0.0 - 0.2 %    Studies/Results: Dg Chest Portable 1 View  Result Date: 01/12/2019 CLINICAL DATA:  Overdose EXAM: PORTABLE CHEST 1 VIEW COMPARISON:  Portable exam 1827 hours without priors for comparison FINDINGS: Normal heart size, mediastinal contours, and pulmonary vascularity. Mild central peribronchial thickening. Question 2 calcified granulomata versus superimposed artifacts at LEFT base. No definite infiltrate, pleural effusion or pneumothorax. Bones unremarkable. IMPRESSION: Bronchitic changes without infiltrate. Electronically Signed   By: Lavonia Dana M.D.   On: 01/12/2019 19:11    Scheduled Meds: . enoxaparin (LOVENOX) injection  40 mg Subcutaneous Q24H   Continuous Infusions: . 0.9 % NaCl with KCl 20 mEq / L 125 mL/hr at 01/13/19 0557   PRN Meds:acetaminophen **OR** acetaminophen, HYDROcodone-acetaminophen, magnesium hydroxide, morphine injection, ondansetron **OR** ondansetron (ZOFRAN) IV, promethazine, traZODone  Assessment/Plan: 1.  Acute kidney injury secondary to recreational accidental overdose of difluoroethane as well as intractable nausea and vomiting with subsequent volume depletion and dehydration.   -Creatinine improving rehydration -Continue to observation telemetry bed.   -Continue to  follow BMP with hydration with IV normal saline.  2.  Intractable nausea and vomiting.  -We will having nausea vomiting states Zofran is not helping - He will be hydrated with IV normal saline and will be provided as needed antiemetics. -May add Phenergan as needed  3.  Recurrent syncope and unresponsiveness.  - Suspeced be related to neurally mediated syncope from recurrent nausea and vomiting and toxic effect of substance inhaled -  Arrhythmia could be in the differential diagnosis of his syncope.  Continue to monitore  for arrhythmias on telemetry.    So far no events -Likely from intoxication - We will avoid IV amiodarone per poison control condition for any arrhythmias.    4.  Depression and anxiety.   -Patient denies any active suicidal thoughts ideas or homicidal thoughts or ideas or plans.  He has had it in the past but never had any plan. - We will continue his Lexapro -Psychiatric consulted and will follow further recs.  Appreciated  5.  Ongoing tobacco abuse.  I counseled him for smoking cessation and he will receive further counseling here. -   6.  Overdose/intoxication: overdose on "dust off" that contains toxic Difluroethane Patient states he was not sure if he did it purposefully. -Poison control was contacted from the emergency who recommended to observe 24 hours and avoid any potentially arrhythmia causing medication is specifically amiodarone -Continue to monitor on telemetry  7.   Electrolyte imbalance:  Check and replace  DVT prophylaxis.  Subcutaneous Lovenox.   All the records are reviewed and case discussed with ED provider. The plan of care was discussed in details with the patient (and family). I answered all questions. The patient agreed to proceed with the above mentioned plan. Further management will depend upon hospital course.   CODE STATUS: Full code   LOS: 0  days   Thomasenia Bottomsawfikul Fredrik Mogel

## 2019-01-13 NOTE — Progress Notes (Signed)
Spoke with Derry Skill pharmacist from the poison control to give an update about patient's current condition. He recommends a repeat EKG in the morning. No current indications for the nausea. Inquire about using paint thinner in addition to the aerosol.

## 2019-01-13 NOTE — TOC Initial Note (Signed)
Transition of Care [facility]) - Initial/Assessment Note    Patient Details  Name: TAMIM SKOG MRN: [id number] Date of Birth: 1990/11/27  Transition of Care [facility]) CM/SW Contact:    Ross Ludwig, LCSW Phone Number: 01/13/2019, 6:32 PM  Clinical Narrative:                 Patient is a 28 year old male, who is alert and oriented x4.  Psych requested that CSW speak to patient and his wife regarding [employer] referral for psych services for patients who do not have insurance.  CSW contacted [employer] [facility], and they referred CSW to inform patient to contact [facility] which is the local agency which can assist with mental health services.  CSW attempted to contact [facility], however they were closed, CSW provided contact information to patient's wife.  CSW to continue to follow patient's progress throughout discharge planning.  Expected Discharge Plan: Home/Self Care Barriers to Discharge: Continued Medical Work up   Patient Goals and CMS Choice Patient states their goals for this hospitalization and ongoing recovery are:: To return back home CMS Medicare.gov Compare Post Acute Care list provided to:: Patient Choice offered to / list presented to : Patient, Spouse  Expected Discharge Plan and Services Expected Discharge Plan: Home/Self Care In-house Referral: NA   Post Acute Care Choice: NA                   DME Arranged: N/A DME Agency: NA       HH Arranged: NA HH Agency: NA        Prior Living Arrangements/Services   Lives with:: Spouse Patient language and need for interpreter reviewed:: Yes Do you feel safe going back to the place where you live?: Yes      Need for Family Participation in Patient Care: No (Comment) Care giver support system in place?: No (comment)   Criminal Activity/Legal Involvement Pertinent to Current Situation/Hospitalization: No - Comment as needed  Activities of Daily Living Home Assistive Devices/Equipment: None ADL Screening (condition  at time of admission) Patient's cognitive ability adequate to safely complete daily activities?: Yes Is the patient deaf or have difficulty hearing?: No Does the patient have difficulty seeing, even when wearing glasses/contacts?: No Does the patient have difficulty concentrating, remembering, or making decisions?: No Patient able to express need for assistance with ADLs?: Yes Does the patient have difficulty dressing or bathing?: No Independently performs ADLs?: Yes (appropriate for developmental age) Does the patient have difficulty walking or climbing stairs?: No Weakness of Legs: None Weakness of Arms/Hands: None  Permission Sought/Granted Permission sought to share information with : Family Supports Permission granted to share information with : Yes, Verbal Permission Granted  Share Information with NAME: Stailey,Whitney Spouse   [phone]           Emotional Assessment Appearance:: Appears stated age     Orientation: : Oriented to Self, Oriented to Place, Oriented to  Time, Oriented to Situation   Psych Involvement: Yes (comment)  Admission diagnosis:  Accidental drug overdose, initial encounter [T50.901A] Acute renal failure, unspecified acute renal failure type [facility]) [N17.9] Patient Active Problem List   Diagnosis Date Noted  . Accidental drug overdose   . AKI (acute kidney injury) ([place]) 01/12/2019   PCP:  System, Pcp Not In Pharmacy:   [facility] [place], [address] [place] [address] Phone: [phone] Fax: [phone]  Social Determinants of Health (SDOH) Interventions    Readmission Risk Interventions No flowsheet data found.

## 2019-01-14 MED ORDER — PROMETHAZINE HCL 25 MG PO TABS: 25.0000 mg | ORAL_TABLET | Freq: Three times a day (TID) | ORAL | 0 refills | Status: DC | PRN

## 2019-01-14 MED ORDER — ESCITALOPRAM OXALATE 10 MG PO TABS: 10.0000 mg | ORAL_TABLET | Freq: Every day | ORAL | 0 refills | Status: DC

## 2019-01-14 MED ORDER — MAGNESIUM HYDROXIDE 400 MG/5ML PO SUSP: 30.0000 mL | Freq: Every day | ORAL | 0 refills | Status: DC | PRN

## 2019-01-14 MED ORDER — ACETAMINOPHEN 325 MG PO TABS: 650.0000 mg | ORAL_TABLET | Freq: Four times a day (QID) | ORAL | 0 refills | Status: AC | PRN

## 2019-01-14 NOTE — TOC Transition Note (Signed)
Transition of Care [facility]) - CM/SW Discharge Note   Patient Details  Name: VAL SCHIAVO MRN: [id number] Date of Birth: 1990/10/29  Transition of Care [facility]) CM/SW Contact:  Ross Ludwig, LCSW Phone Number: 01/14/2019, 1:02 PM   Clinical Narrative:     CSW contacted [facility] today for patient to get be seen as an outpatient mental health.  CSW was informed by [facility] to fax clinicals to [phone] and to inform patient that he needs to go MWF between 8a-3pm.  CSW provided patient's wife who was at bedside of information that he will need.  Patient and wife were appreciative of information given, and plan to discharge today.   Final next level of care: Home/Self Care Barriers to Discharge: Barriers Resolved   Patient Goals and CMS Choice Patient states their goals for this hospitalization and ongoing recovery are:: To return back home CMS Medicare.gov Compare Post Acute Care list provided to:: Patient Choice offered to / list presented to : Patient  Discharge Placement  Patient discharging back home with outpatient mental health referral information.           Discharge Plan and Services In-house Referral: NA   Post Acute Care Choice: NA          DME Arranged: N/A DME Agency: NA     Representative spoke with at DME Agency: na HH Arranged: NA [place] Agency: NA        Social Determinants of Health (SDOH) Interventions     Readmission Risk Interventions No flowsheet data found.

## 2019-01-14 NOTE — Progress Notes (Signed)
Discharge instructions explained to pt and pts wife/ verbalized an understanding/ iv and tele removed/ transported off unit via wheelchair.  

## 2019-01-14 NOTE — Discharge Summary (Signed)
Physician Discharge Summary  Patient ID: Samuel Davila MRN: [id number] DOB/AGE: September 30, 1990 28 y.o.  Admit date: 01/12/2019 Discharge date: 01/14/2019  Admission Diagnoses:  Discharge Diagnoses:  Active Problems:   AKI (acute kidney injury) ([place])   Accidental drug overdose   Discharged Condition: fair  Hospital Course:  28 y.o.obese Caucasian malewith a known history of depression and anxiety, who presented to the emergency room with acute onset of overdose on"dust off"that contains toxic Difluroethane. The patient had been inhaling several bottles over the weekend from Thursday. He stated that he was trying to get high. He was started on p.o. Lexapro for depression and anxiety about a month ago. He has not experimented with such inhalation of dust off since high school. He had recurrent nausea and vomiting throughout the weekend as well as several syncopal episodes and spotty amnesia per his report. He was found unresponsive today by his brother-in-law who called EMS. The patient denied any chest pain or palpitations.  COVID-19 tested negative from ED. Poison control was contacted. Recommendation was for 24-hour observation for arrhythmias and avoiding amiodarone for ventricular fibrillation or ventricular tachycardia. He will be admitted to telemetry observation bed for further evaluation and monitoring. Psych consulted.   1. Acute kidney injury secondary to recreational accidental overdose ofdifluoroethaneas well as intractable nausea and vomitingwith subsequent volume depletion and dehydration.  -Creatinine continued to improve with rehydration rehydration -Was encouraged to take p.o. fluid -Also strongly advised to follow-up with PCP in a week or 2.  2. Intractable nausea and vomiting. -Vomiting resolved.  Patient had some mild nausea which was elevated with Phenergan -She was able to have his meal before discharge and tolerated well -Received adequate IV hydration  and as needed antiemetics. -Phenergan Rx given to patient  3. Recurrent syncope and unresponsiveness. -Questionable but no new issues - Suspeced be related to neurally mediated syncope from recurrent nausea and vomiting and toxic effect of substance inhaled - Was monitored  for arrhythmias on telemetry no events brought to the attention  over close to 48 hours-Likely from intoxication - Avoid IV amiodarone per poison control condition for any arrhythmias.   4. Depression and anxiety.  -No acute issue -Patient denies any active suicidal thoughts ideas or homicidal thoughts or ideas or plans.  - Continue Lexapro--Rx given -Psychiatric consulted and cleared patient advised to outpatient follow-up.  Patient has been given outpatient referral and or resources by the psychiatry  5. Ongoing tobacco abuse. Was a nicotine patch Rx given - Provided counseling  6.  Overdose/intoxication: overdose on"dust off"that contains toxic Difluroethane Patient states he was not sure if he did it purposefully. -Poison control was contacted from the emergency who recommended to observe 24 hours and avoid any potentially arrhythmia causing medication is specifically amiodarone. no arrhythmia or events recorded for telemetry over close to 48 hours -Was again given counseling -Also advised to follow-up with PCP and psychiatry closely  7.  Electrolyte imbalance: Replaced and is stabilized  Consults: psychiatry  Significant Diagnostic Studies: Diagnostic studies with radiology and laboratory  Treatments: As per hospital course and discharge med list  Discharge Exam: Blood pressure 137/78, pulse 73, temperature 99.1 F (37.3 C), temperature source Oral, resp. rate 14, height 5\' 11"  (1.803 m), weight 115.8 kg, SpO2 100 %.  GENERAL: Patient to be very tired.  Obese EYES: Pupils equal, round, reactive to light and accommodation. No scleral icterus. Extraocular muscles intact.  HEENT: Head  atraumatic, normocephalic. Oropharynxwith slightly dry tongue and moist mucous membrane. And  nasopharynx clear.  NECK: Supple, no jugular venous distention. No thyroid enlargement, no tenderness.  LUNGS: Normal breath sounds bilaterally, no wheezing, rales,rhonchi or crepitation. No use of accessory muscles of respiration.  CARDIOVASCULAR: Regular rate and rhythm, S1, S2 normal. No murmurs, rubs, or gallops.  ABDOMEN: Soft, nondistended, nontender. Bowel sounds present. No organomegaly or mass.  EXTREMITIES: No pedal edema, cyanosis, or clubbing.  NEUROLOGIC: Cranial nerves II through XII are intact. Muscle strength 5/5 in all extremities. Sensation intact. Gait not checked. Musculoskeletal: He had mild left lower paraspinal tenderness PSYCHIATRIC: The patient is alert and oriented x 3. Normal affect and good eye contact. SKIN: No obvious rash, lesion, or ulcer.   Disposition: Discharge disposition: 01-Home or Self Care      And was strongly advised on possible drug addiction and withdrawal.  He was also given warning signs and symptoms on any suicidal thoughts or ideas or homicidal thoughts or ideas.  Explained in details to patient and his wife the importance of having a close follow-up with PCP and psychiatry. answered all their questions.  Explained the signs and symptoms when he must seek immediate medical attention.  Patient and wife expressed understanding.  Discharge Instructions    Call MD for:  difficulty breathing, headache or visual disturbances   Complete by: As directed    Call MD for:  hives   Complete by: As directed    Call MD for:  persistant dizziness or light-headedness   Complete by: As directed    Diet - low sodium heart healthy   Complete by: As directed    Increase activity slowly   Complete by: As directed      Allergies as of 01/14/2019   No Known Allergies     Medication List    TAKE these medications   acetaminophen 325 MG tablet Commonly known  as: TYLENOL Take 2 tablets (650 mg total) by mouth every 6 (six) hours as needed for mild pain (or Fever >/= 101).   escitalopram 10 MG tablet Commonly known as: LEXAPRO Take 1 tablet (10 mg total) by mouth daily. Start taking on: January 15, 2019   magnesium hydroxide 400 MG/5ML suspension Commonly known as: MILK OF MAGNESIA Take 30 mLs by mouth daily as needed for mild constipation.   ondansetron 4 MG disintegrating tablet Commonly known as: ZOFRAN-ODT Take 1-2 tablets (4-8 mg total) by mouth every 8 (eight) hours as needed for nausea or vomiting.   pantoprazole 20 MG tablet Commonly known as: PROTONIX Take 2 tablets (40 mg total) by mouth daily.   potassium chloride SA 20 MEQ tablet Commonly known as: KLOR-CON Take 1 tablet (20 mEq total) by mouth 2 (two) times daily for 3 days.   promethazine 25 MG tablet Commonly known as: PHENERGAN Take 1 tablet (25 mg total) by mouth every 8 (eight) hours as needed for nausea, vomiting or refractory nausea / vomiting.        Signed: Thomasenia Bottoms 01/14/2019, 1:43 PM

## 2019-01-14 NOTE — Discharge Instructions (Signed)
Living With Anxiety  After being diagnosed with an anxiety disorder, you may be relieved to know why you have felt or behaved a certain way. It is natural to also feel overwhelmed about the treatment ahead and what it will mean for your life. With care and support, you can manage this condition and recover from it. How to cope with anxiety Dealing with stress Stress is your bodys reaction to life changes and events, both good and bad. Stress can last just a few hours or it can be ongoing. Stress can play a major role in anxiety, so it is important to learn both how to cope with stress and how to think about it differently. Talk with your health care provider or a counselor to learn more about stress reduction. He or she may suggest some stress reduction techniques, such as:  Music therapy. This can include creating or listening to music that you enjoy and that inspires you.  Mindfulness-based meditation. This involves being aware of your normal breaths, rather than trying to control your breathing. It can be done while sitting or walking.  Centering prayer. This is a kind of meditation that involves focusing on a word, phrase, or sacred image that is meaningful to you and that brings you peace.  Deep breathing. To do this, expand your stomach and inhale slowly through your nose. Hold your breath for 3-5 seconds. Then exhale slowly, allowing your stomach muscles to relax.  Self-talk. This is a skill where you identify thought patterns that lead to anxiety reactions and correct those thoughts.  Muscle relaxation. This involves tensing muscles then relaxing them. Choose a stress reduction technique that fits your lifestyle and personality. Stress reduction techniques take time and practice. Set aside 5-15 minutes a day to do them. Therapists can offer training in these techniques. The training may be covered by some insurance plans. Other things you can do to manage stress include:  Keeping a  stress diary. This can help you learn what triggers your stress and ways to control your response.  Thinking about how you respond to certain situations. You may not be able to control everything, but you can control your reaction.  Making time for activities that help you relax, and not feeling guilty about spending your time in this way. Therapy combined with coping and stress-reduction skills provides the best chance for successful treatment. Medicines Medicines can help ease symptoms. Medicines for anxiety include:  Anti-anxiety drugs.  Antidepressants.  Beta-blockers. Medicines may be used as the main treatment for anxiety disorder, along with therapy, or if other treatments are not working. Medicines should be prescribed by a health care provider. Relationships Relationships can play a big part in helping you recover. Try to spend more time connecting with trusted friends and family members. Consider going to couples counseling, taking family education classes, or going to family therapy. Therapy can help you and others better understand the condition. How to recognize changes in your condition Everyone has a different response to treatment for anxiety. Recovery from anxiety happens when symptoms decrease and stop interfering with your daily activities at home or work. This may mean that you will start to:  Have better concentration and focus.  Sleep better.  Be less irritable.  Have more energy.  Have improved memory. It is important to recognize when your condition is getting worse. Contact your health care provider if your symptoms interfere with home or work and you do not feel like your condition is improving. Where  to find help and support: You can get help and support from these sources:  Self-help groups.  Online and [employer].  A trusted spiritual leader.  Couples counseling.  Family education classes.  Family therapy. Follow these instructions  at home:  Eat a healthy diet that includes plenty of vegetables, fruits, whole grains, low-fat dairy products, and lean protein. Do not eat a lot of foods that are high in solid fats, added sugars, or salt.  Exercise. Most adults should do the following: ? Exercise for at least 150 minutes each week. The exercise should increase your heart rate and make you sweat (moderate-intensity exercise). ? Strengthening exercises at least twice a week.  Cut down on caffeine, tobacco, alcohol, and other potentially harmful substances.  Get the right amount and quality of sleep. Most adults need 7-9 hours of sleep each night.  Make choices that simplify your life.  Take over-the-counter and prescription medicines only as told by your health care provider.  Avoid caffeine, alcohol, and certain over-the-counter cold medicines. These may make you feel worse. Ask your pharmacist which medicines to avoid.  Keep all follow-up visits as told by your health care provider. This is important. Questions to ask your health care provider  Would I benefit from therapy?  How often should I follow up with a health care provider?  How long do I need to take medicine?  Are there any long-term side effects of my medicine?  Are there any alternatives to taking medicine? Contact a health care provider if:  You have a hard time staying focused or finishing daily tasks.  You spend many hours a day feeling worried about everyday life.  You become exhausted by worry.  You start to have headaches, feel tense, or have nausea.  You urinate more than normal.  You have diarrhea. Get help right away if:  You have a racing heart and shortness of breath.  You have thoughts of hurting yourself or others. If you ever feel like you may hurt yourself or others, or have thoughts about taking your own life, get help right away. You can go to your nearest emergency department or call:  Your local emergency services  (911 in the [place]  A suicide crisis helpline, such as the [facility] at (607) 019-7677. This is open 24-hours a day. Summary  Taking steps to deal with stress can help calm you.  Medicines cannot cure anxiety disorders, but they can help ease symptoms.  Family, friends, and partners can play a big part in helping you recover from an anxiety disorder. This information is not intended to replace advice given to you by your health care provider. Make sure you discuss any questions you have with your health care provider. Document Released: 01/18/2016 Document Revised: 01/05/2017 Document Reviewed: 01/18/2016 Elsevier Patient Education  2020 [employer]. Inhalant Use Disorder Inhalant use disorder is a behavioral and mental health disorder. People with this disorder inhale harmful fumes from common household products on purpose to get a feeling of well-being (euphoria). These products commonly include:  Solvents. These are cleaning fluids such as paint thinner, nail polish remover, carpet cleaner, and degreasers.  Aerosols. These include hair spray, spray deodorants, spray paint, air fresheners, and computer air dusters.  Glues and adhesives.  Fuels, such as gasoline, lighter fluid (butane), and propane.  Other products, including nail polish, shoe polish, liquid correction fluid, and felt-tip markers. The fumes may be inhaled through the nose, mouth, or both in the following  ways:  Directly from the container. This is known as "glading" with air freshener and "dusting" with computer air dusters.  From a bag filled with fumes from the container.  From a rag soaked with liquid (huffing). This disorder is most common among children and teenagers and usually stops during young adulthood. It can be treated with therapy and support groups. What are the causes? The cause of this condition is not known. What increases the risk? Inhalant use disorder is more  likely to develop if you have:  Difficulty accessing other drugs or alcohol due to age restrictions or finances.  A personal history of drug use.  A different mental condition, such as depression, anxiety, or antisocial personality disorder. What are the signs or symptoms? After inhaling fumes, effects start quickly and last for a short time. Short-term effects may include:  Euphoria.  Dizziness.  Loss of coordination.  Slurred speech.  Slowed movements.  Slowed thinking.  Shaking.  Muscle weakness.  Changes in vision. You may have inhalant use disorder if:  You use inhalants in larger amounts or over a longer period of time than planned.  You try to cut down or control inhalant use, but you are not able to.  You spend a lot of time getting or using inhalants or recovering from the effects.  You continue to use inhalants even if you have major problems at work, school, or home because of inhalant use.  You give up or cut down on important life activities because of inhalant use.  You use inhalants repeatedly in situations when it is unsafe, such as while driving a car.  You continue to use inhalants even though you know that you have physical and mental symptoms related to inhalant use.  You have to use higher amounts of inhalants to get the same or desired effect (tolerance). Using inhalants frequently or for a long time can cause:  Problems with normal life activities or relationships.  Physical problems, such as: ? Sores around the nose or mouth. This is referred to as "glue-sniffers rash." ? Long-term (chronic) runny nose or cough. ? Lung problems. ? Vision loss. ? Long-term (chronic) pain due to nerve damage. ? Abnormal movements due to brain damage. ? Liver or kidney damage.  Mental problems, such as: ? Depression. ? Anxiety. ? Seeing, hearing, feeling, smelling, or tasting things that are not there (hallucinations). ? Feeling paranoid  (psychosis). ? Problems with memory and thinking. Severe inhalant intoxication may cause death due to impaired driving, coma, suffocation, or sudden abnormal heartbeats. How is this diagnosed? This condition may be diagnosed based on:  An assessment by your health care provider. He or she may ask questions about your inhalant use and any problems it causes.  A physical exam.  Tests to check (screen) for drugs in your system.  An assessment by a mental health care provider. How is this treated? This condition is usually treated by mental health providers. Treatment may include:  Individual and family therapy to help you stop using inhalants. Therapy can help you: ? Identify and avoid triggers for inhalant use. ? Handle cravings. ? Replace inhalant use with healthy activities. ? Make a plan to continue to not use inhalants (maintain sobriety).  Support groups. These can provide emotional support, advice, and guidance.  Medicine to treat mental problems that are caused by inhalant use. The most effective treatment is usually a combination of therapy, support groups, and family support. Follow these instructions at home:  Take over-the-counter  and prescription medicines only as told by your health care provider. Do not start taking any new over-the-counter or prescription medicines before first getting approval from your health care provider.  Attend family and individual therapy and support groups as recommended.  Try to avoid situations and people that trigger inhalant use for you. Find hobbies and activities that you enjoy instead, such as exercising or playing a sport.  Keep all follow-up visits as told by your health care provider. This is important. Contact a health care provider if:  Your symptoms get worse or you develop new symptoms.  You are not able to take your medicines as instructed. Get help right away if:  You have thoughts about hurting yourself or someone  else.  You have trouble breathing.  You cough up blood.  You faint.  You have pain that gets worse or does not get better with medicine. If you ever feel like you may hurt yourself or others, or have thoughts about taking your own life, get help right away. You can go to your nearest emergency department or call:  Your local emergency services (911 in the [place]  A suicide crisis helpline, such as the National Suicide Prevention Lifeline at 985-001-[address]531 450 7445. This is open 24 hours a day. Summary  Inhalant use disorder occurs when people inhale harmful fumes from common household products on purpose to get a feeling of well-being (euphoria).  These products are often used by children and teens who may also have depression or anxiety. If you are depressed or anxious, it is important to get help from a mental health care provider.  Using inhalants frequently or for a long time can cause a variety of dangerous mental and physical problems.  The most effective treatment is usually a combination of therapy, support groups, and family support. Therapy can help you maintain sobriety. This information is not intended to replace advice given to you by your health care provider. Make sure you discuss any questions you have with your health care provider. Document Released: 05/01/2000 Document Revised: 01/05/2017 Document Reviewed: 11/20/2016 Elsevier Patient Education  2020 [employer]

## 2019-09-20 ENCOUNTER — Other Ambulatory Visit: Payer: Self-pay

## 2019-09-20 ENCOUNTER — Ambulatory Visit
Admission: EM | Admit: 2019-09-20 | Discharge: 2019-09-20 | Disposition: A | Discharge status: Home / Self Care | Payer: Self-pay | Attending: Emergency Medicine | Admitting: Emergency Medicine

## 2019-09-20 DIAGNOSIS — B3783 Candidal cheilitis: Secondary | ICD-10-CM

## 2019-09-20 MED ORDER — NYSTATIN 100000 UNIT/ML MT SUSP: 500000.0000 [IU] | Freq: Four times a day (QID) | OROMUCOSAL | 0 refills | Status: DC

## 2019-09-20 MED ORDER — MUPIROCIN 2 % EX OINT: 1.0000 "application " | TOPICAL_OINTMENT | Freq: Three times a day (TID) | CUTANEOUS | 0 refills | Status: DC

## 2019-09-20 NOTE — ED Triage Notes (Signed)
Patient in today for swelling in his upper lip. Patient states sx onset was Thursday. Denies fever, loss of sensation, or tingling or numbness in face.

## 2019-09-20 NOTE — ED Provider Notes (Signed)
[facility]    CSN: [id number] Arrival date & time: 09/20/19  0850      History   Chief Complaint Chief Complaint  Patient presents with  . Oral Swelling    Upper lip    HPI Samuel Davila is a 29 y.o. male.   HPI  28 year old male presents with swelling of his upper lip.  States that the onset was on Thursday 2 days prior to this visit.  Denies fever loss of sensation or tingling or numbness in his face.  Has noticed a small area on the angle of his lip on the left that has the appearance of an abrasion but he thought it may have been from a bug bite.  He said he awoke with this after no problem going to sleep for prior evening.  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  For the swelling he has been using Benadryl which helps to reduce the swelling.  Swelling was worse this morning but after taking the Benadryl it has improved.  He states overall the swelling has improved.  He has no pain he has had no injury.        History reviewed. No pertinent past medical history.  Patient Active Problem List   Diagnosis Date Noted  . Accidental drug overdose   . AKI (acute kidney injury) (HCC) 01/12/2019    Past Surgical History:  Procedure Laterality Date  . NO PAST SURGERIES         Home Medications    Prior to Admission medications   Medication Sig Start Date End Date Taking? Authorizing Provider  acetaminophen (TYLENOL) 325 MG tablet Take 2 tablets (650 mg total) by mouth every 6 (six) hours as needed for mild pain (or Fever >/= 101). 01/14/19  Yes Thomasenia Bottoms, MD  mupirocin ointment (BACTROBAN) 2 % Apply 1 application topically 3 (three) times daily. 09/20/19   Lutricia Feil, PA-C  nystatin (MYCOSTATIN) 100000 UNIT/ML suspension Take 5 mLs (500,000 Units total) by mouth 4 (four) times daily. 09/20/19   Lutricia Feil, PA-C  escitalopram (LEXAPRO) 10 MG tablet Take 1 tablet  (10 mg total) by mouth daily. 01/15/19 09/20/19  Thomasenia Bottoms, MD  pantoprazole (PROTONIX) 20 MG tablet Take 2 tablets (40 mg total) by mouth daily. Patient not taking: Reported on 01/12/2019 09/02/18 09/20/19  Tommie Sams, DO  potassium chloride SA (K-DUR) 20 MEQ tablet Take 1 tablet (20 mEq total) by mouth 2 (two) times daily for 3 days. 09/02/18 09/20/19  Tommie Sams, DO  promethazine (PHENERGAN) 25 MG tablet Take 1 tablet (25 mg total) by mouth every 8 (eight) hours as needed for nausea, vomiting or refractory nausea / vomiting. 01/14/19 09/20/19  Thomasenia Bottoms, MD    Family History History reviewed. No pertinent family history.  Social History Social History   Tobacco Use  . Smoking status: Current Every Day Smoker    Packs/day: 1.00    Types: Cigarettes  . Smokeless tobacco: Never Used  Vaping Use  . Vaping Use: Never used  Substance Use Topics  . Alcohol use: Yes    Comment: occasionally  . Drug use: Yes    Types: Marijuana     Allergies   Patient has no known allergies.   Review of Systems Review of Systems  Constitutional: Negative for activity change, appetite change, chills, fatigue and fever.  HENT: Positive for facial swelling.   All other  systems reviewed and are negative.    Physical Exam Triage Vital Signs ED Triage Vitals  Enc Vitals Group     BP 09/20/19 0903 (!) 149/88     Pulse Rate 09/20/19 0903 68     Resp 09/20/19 0903 18     Temp 09/20/19 0903 99 F (37.2 C)     Temp Source 09/20/19 0903 Oral     SpO2 09/20/19 0903 99 %     Weight 09/20/19 0908 267 lb (121.1 kg)     Height 09/20/19 0908 5\' 11"  (1.803 m)     Head Circumference --      Peak Flow --      Pain Score 09/20/19 0907 7     Pain Loc --      Pain Edu? --      Excl. in GC? --    No data found.  Updated Vital Signs BP (!) 149/88 (BP Location: Left Arm)   Pulse 68   Temp 99 F (37.2 C) (Oral)   Resp 18   Ht 5\' 11"  (1.803 m)   Wt 267 lb (121.1 kg)   SpO2 99%   BMI 37.24  kg/m   Visual Acuity Right Eye Distance:   Left Eye Distance:   Bilateral Distance:    Right Eye Near:   Left Eye Near:    Bilateral Near:     Physical Exam Vitals and nursing note reviewed.  Constitutional:      General: He is not in acute distress.    Appearance: Normal appearance. He is not ill-appearing or toxic-appearing.  HENT:     Head: Normocephalic and atraumatic.     Mouth/Throat:     Mouth: Mucous membranes are moist.     Comments: Examination of the mouth shows some mild swelling of the upper lip.  He has a small abrasion type lesion at the angle of his lip on the left.  Mucosa in the upper lip has a white film covering that is not present on the bottom.  There is a white coating of the tongue but no evidence on the oropharynx.  Refer to photographs for details. Eyes:     Conjunctiva/sclera: Conjunctivae normal.  Musculoskeletal:        General: Normal range of motion.  Skin:    General: Skin is warm and dry.     Findings: Rash present.  Neurological:     General: No focal deficit present.     Mental Status: He is alert and oriented to person, place, and time.  Psychiatric:        Mood and Affect: Mood normal.        Behavior: Behavior normal.        Thought Content: Thought content normal.        Judgment: Judgment normal.      UC Treatments / Results  Labs (all labs ordered are listed, but only abnormal results are displayed) Labs Reviewed - No data to display      EKG   Radiology No results found.  Procedures Procedures (including critical care time)  Medications Ordered in UC Medications - No data to display  Initial Impression / Assessment and Plan / UC Course  I have reviewed the triage vital signs and the nursing notes.  Pertinent labs & imaging results that were available during my care of the patient were reviewed by me and considered in my medical decision making (see chart for details).   29 year old male presents today with  swelling of his upper lip.  States that the swelling actually started 2 days prior to this visit.  He has had no fever or loss of sensation or tingling or numbness of his face.  Not take any medications or had any change in his diet.  Issues no recent creams or lotions that are unusual to his usual routine.  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  This caused him some anxiety.  Examination showed an abrasion type lesion over the lateral aspect of the upper lip on the left.  The mucosal surface of his upper lip also had white film.  Refer to photographs for detail.  I will place him on nystatin swish and swallow.  He was given instructions on how to use this.  Also provided him with mupirocin ointment to apply to the external sore.  If he is not improving in a week he should follow-up with dermatology.  Address and phone number were provided to the patient for his convenience.   Final Clinical Impressions(s) / UC Diagnoses   Final diagnoses:  Candidal cheilitis     Discharge Instructions     Use the nystatin swish and swallow 4 times daily.  Try to hold against her upper lip internally as long as possible.  Apply the mupirocin ointment to the angular sore on your outer lip.  3 times daily.  If not improving follow-up with dermatology.    ED Prescriptions    Medication Sig Dispense Auth. Provider   nystatin (MYCOSTATIN) 100000 UNIT/ML suspension Take 5 mLs (500,000 Units total) by mouth 4 (four) times daily. 60 mL Ovid Curd P, PA-C   mupirocin ointment (BACTROBAN) 2 % Apply 1 application topically 3 (three) times daily. 22 g Lutricia Feil, PA-C     PDMP not reviewed this encounter.   Lutricia Feil, PA-C 09/20/19 1814

## 2019-09-20 NOTE — Discharge Instructions (Addendum)
Use the nystatin swish and swallow 4 times daily.  Try to hold against her upper lip internally as long as possible.  Apply the mupirocin ointment to the angular sore on your outer lip.  3 times daily.  If not improving follow-up with dermatology.

## 2020-02-05 ENCOUNTER — Other Ambulatory Visit: Payer: Self-pay

## 2020-02-05 ENCOUNTER — Ambulatory Visit
Admission: RE | Admit: 2020-02-05 | Discharge: 2020-02-05 | Disposition: A | Discharge status: Home / Self Care | Payer: Self-pay | Source: Ambulatory Visit | Attending: Physician Assistant | Admitting: Physician Assistant

## 2020-02-05 VITALS — BP 128/78 | HR 85 | Temp 98.1°F | Resp 18 | Ht 71.0 in | Wt 265.0 lb

## 2020-02-05 DIAGNOSIS — Z20822 Contact with and (suspected) exposure to covid-19: Secondary | ICD-10-CM | POA: Insufficient documentation

## 2020-02-05 DIAGNOSIS — R051 Acute cough: Secondary | ICD-10-CM | POA: Insufficient documentation

## 2020-02-05 DIAGNOSIS — R059 Cough, unspecified: Secondary | ICD-10-CM

## 2020-02-05 DIAGNOSIS — R0981 Nasal congestion: Secondary | ICD-10-CM | POA: Insufficient documentation

## 2020-02-05 DIAGNOSIS — J069 Acute upper respiratory infection, unspecified: Secondary | ICD-10-CM | POA: Insufficient documentation

## 2020-02-05 DIAGNOSIS — Z79899 Other long term (current) drug therapy: Secondary | ICD-10-CM | POA: Insufficient documentation

## 2020-02-05 DIAGNOSIS — F17210 Nicotine dependence, cigarettes, uncomplicated: Secondary | ICD-10-CM | POA: Insufficient documentation

## 2020-02-05 LAB — RESP PANEL BY RT-PCR (FLU A&B, COVID) ARPGX2
Influenza A by PCR: NEGATIVE
Influenza B by PCR: NEGATIVE
SARS Coronavirus 2 by RT PCR: NEGATIVE

## 2020-02-05 NOTE — ED Triage Notes (Signed)
Patient in today c/o nasal congestion and cough x 1 week. Patient denies fever. Patient has not had the covid vaccines. Patient has taken OTC nasal saline spray.

## 2020-02-05 NOTE — Discharge Instructions (Addendum)

## 2020-02-05 NOTE — ED Provider Notes (Signed)
[facility]    CSN: [id number] Arrival date & time: 02/05/20  6967      History   Chief Complaint Chief Complaint  Patient presents with  . Appointment  . Nasal Congestion       . Cough    HPI Samuel Davila is a 29 y.o. male presenting for 1 week history of nasal congestion and cough.  Patient says he made an appointment with [place] urgent [facility] 2 days ago and states that he felt worse at that time.  Patient states that over the past couple days he has started to feel a little better.  Patient denies any associated fever, fatigue, body aches, sore throat, headaches, chest pain or breathing problems.  Denies any abdominal pain, N/V/D.  Patient denies any known Covid exposure.  He has not been vaccinated for COVID-19.  Patient says he tried over-the-counter nasal spray, but no other over-the-counter medications.  Patient has no history of pulmonary disease.  No asthma history.  It is a smoker.  No other complaints or concerns today.  HPI  History reviewed. No pertinent past medical history.  Patient Active Problem List   Diagnosis Date Noted  . Accidental drug overdose   . AKI (acute kidney injury) (HCC) 01/12/2019    Past Surgical History:  Procedure Laterality Date  . NO PAST SURGERIES         Home Medications    Prior to Admission medications   Medication Sig Start Date End Date Taking? Authorizing Provider  acetaminophen (TYLENOL) 325 MG tablet Take 2 tablets (650 mg total) by mouth every 6 (six) hours as needed for mild pain (or Fever >/= 101). 01/14/19  Yes Thomasenia Bottoms, MD  mupirocin ointment (BACTROBAN) 2 % Apply 1 application topically 3 (three) times daily. 09/20/19   Lutricia Feil, PA-C  nystatin (MYCOSTATIN) 100000 UNIT/ML suspension Take 5 mLs (500,000 Units total) by mouth 4 (four) times daily. 09/20/19   Lutricia Feil, PA-C  escitalopram (LEXAPRO) 10 MG tablet Take 1 tablet (10 mg total) by mouth daily. 01/15/19 09/20/19  Thomasenia Bottoms,  MD  pantoprazole (PROTONIX) 20 MG tablet Take 2 tablets (40 mg total) by mouth daily. Patient not taking: Reported on 01/12/2019 09/02/18 09/20/19  Tommie Sams, DO  potassium chloride SA (K-DUR) 20 MEQ tablet Take 1 tablet (20 mEq total) by mouth 2 (two) times daily for 3 days. 09/02/18 09/20/19  Tommie Sams, DO  promethazine (PHENERGAN) 25 MG tablet Take 1 tablet (25 mg total) by mouth every 8 (eight) hours as needed for nausea, vomiting or refractory nausea / vomiting. 01/14/19 09/20/19  Thomasenia Bottoms, MD    Family History Family History  Problem Relation Age of Onset  . Healthy Mother   . Healthy Father     Social History Social History   Tobacco Use  . Smoking status: Current Every Day Smoker    Packs/day: 1.00    Types: Cigarettes  . Smokeless tobacco: Never Used  Vaping Use  . Vaping Use: Never used  Substance Use Topics  . Alcohol use: Yes    Comment: occasionally  . Drug use: Yes    Frequency: 2.0 times per week    Types: Marijuana     Allergies   Patient has no known allergies.   Review of Systems Review of Systems  Constitutional: Negative for fatigue and fever.  HENT: Positive for congestion. Negative for rhinorrhea, sinus pressure, sinus pain and sore throat.   Respiratory: Positive for cough.  Negative for shortness of breath.   Gastrointestinal: Negative for abdominal pain, diarrhea, nausea and vomiting.  Musculoskeletal: Negative for myalgias.  Neurological: Negative for weakness, light-headedness and headaches.  Hematological: Negative for adenopathy.     Physical Exam Triage Vital Signs ED Triage Vitals  Enc Vitals Group     BP      Pulse      Resp      Temp      Temp src      SpO2      Weight      Height      Head Circumference      Peak Flow      Pain Score      Pain Loc      Pain Edu?      Excl. in [place]?    No data found.  Updated Vital Signs BP 128/78 (BP Location: Left Arm)   Pulse 85   Temp 98.1 F (36.7 C) (Oral)   Resp 18    Ht 5\' 11"  (1.803 m)   Wt 265 lb (120.2 kg)   SpO2 98%   BMI 36.96 kg/m    Physical Exam Vitals and nursing note reviewed.  Constitutional:      General: He is not in acute distress.    Appearance: Normal appearance. He is well-developed and well-nourished. He is not ill-appearing.  HENT:     Head: Normocephalic and atraumatic.     Right Ear: Tympanic membrane, ear canal and external ear normal.     Left Ear: Tympanic membrane, ear canal and external ear normal.     Nose: Congestion (mild nasal mucosal edema) present. No rhinorrhea.     Mouth/Throat:     Mouth: Oropharynx is clear and moist and mucous membranes are normal. Mucous membranes are moist.     Pharynx: Oropharynx is clear. Uvula midline. No oropharyngeal exudate.     Tonsils: No tonsillar abscesses.  Eyes:     General: No scleral icterus.       Right eye: No discharge.        Left eye: No discharge.     Extraocular Movements: EOM normal.     Conjunctiva/sclera: Conjunctivae normal.  Neck:     Thyroid: No thyromegaly.     Trachea: No tracheal deviation.  Cardiovascular:     Rate and Rhythm: Normal rate and regular rhythm.     Heart sounds: Normal heart sounds.  Pulmonary:     Effort: Pulmonary effort is normal. No respiratory distress.     Breath sounds: Normal breath sounds. No wheezing or rales.  Musculoskeletal:     Cervical back: Neck supple.  Skin:    General: Skin is warm and dry.     Findings: No rash.  Neurological:     General: No focal deficit present.     Mental Status: He is alert. Mental status is at baseline.     Motor: No weakness.     Gait: Gait normal.  Psychiatric:        Mood and Affect: Mood normal.        Behavior: Behavior normal.        Thought Content: Thought content normal.      UC Treatments / Results  Labs (all labs ordered are listed, but only abnormal results are displayed) Labs Reviewed  RESP PANEL BY RT-PCR (FLU A&B, COVID) [id number]    EKG   Radiology No results  found.  Procedures Procedures (including critical care time)  Medications Ordered in UC Medications - No data to display  Initial Impression / Assessment and Plan / UC Course  I have reviewed the triage vital signs and the nursing notes.  Pertinent labs & imaging results that were available during my care of the patient were reviewed by me and considered in my medical decision making (see chart for details).   29 year old male with 1 week history of improving nasal congestion and cough.  All vital signs are normal and stable.  Exam is mostly benign.  Chest is clear to auscultation.  Respiratory panel obtained.  Advised patient I will call him with the results.  Advised him to isolate until he hears from [place].  CDC guidelines, isolation protocol and ED precautions reviewed if Covid positive.  Advised patient he likely has a viral infection and should feel better within 7 to 10 days.  Advised supportive care with increasing rest and fluids.  Advised him to consider taking Mucinex D in addition to using the nasal saline spray that he has been using.  Advised against antibiotic use at this time especially since symptoms are getting better.  Return and ED precautions reviewed again with patient.  Patient declined any prescription cough medications.  Neg resp panel. Pt notified.   Final Clinical Impressions(s) / UC Diagnoses   Final diagnoses:  Viral upper respiratory tract infection  Cough  Nasal congestion     Discharge Instructions     URI/COLD SYMPTOMS: Your exam today is consistent with a viral illness. Antibiotics are not indicated at this time. Use medications as directed, including cough syrup, nasal saline, and decongestants. Your symptoms should improve over the next few days and resolve within 7-10 days. Increase rest and fluids. F/u if symptoms worsen or predominate such as sore throat, ear pain, productive cough, shortness of breath, or if you develop high fevers or worsening  fatigue over the next several days.    You have received COVID testing today either for positive exposure, concerning symptoms that could be related to COVID infection, screening purposes, or re-testing after confirmed positive.  Your test obtained today checks for active viral infection in the last 1-2 weeks. If your test is negative now, you can still test positive later. So, if you do develop symptoms you should either get re-tested and/or isolate x 10 days. Please follow CDC guidelines.  While Rapid antigen tests come back in 15-20 minutes, send out PCR/molecular test results typically come back within 24 hours. In the mean time, if you are symptomatic, assume this could be a positive test and treat/monitor yourself as if you do have COVID.   We will call with test results. Please download the MyChart app and set up a profile to access test results.   If symptomatic, go home and rest. Push fluids. Take Tylenol as needed for discomfort. Gargle warm salt water. Throat lozenges. Take Mucinex DM or Robitussin for cough. Humidifier in bedroom to ease coughing. Warm showers. Also review the COVID handout for more information.  COVID-19 INFECTION: The incubation period of COVID-19 is approximately 14 days after exposure, with most symptoms developing in roughly 4-5 days. Symptoms may range in severity from mild to critically severe. Roughly 80% of those infected will have mild symptoms. People of any age may become infected with COVID-19 and have the ability to transmit the virus. The most common symptoms include: fever, fatigue, cough, body aches, headaches, sore throat, nasal congestion, shortness of breath, nausea, vomiting, diarrhea, changes in smell and/or taste.  COURSE OF ILLNESS Some patients may begin with mild disease which can progress quickly into critical symptoms. If your symptoms are worsening please call ahead to the Emergency Department and proceed there for further treatment. Recovery  time appears to be roughly 1-2 weeks for mild symptoms and 3-6 weeks for severe disease.   GO IMMEDIATELY TO ER FOR FEVER YOU ARE UNABLE TO GET DOWN WITH TYLENOL, BREATHING PROBLEMS, CHEST PAIN, FATIGUE, LETHARGY, INABILITY TO EAT OR DRINK, ETC  QUARANTINE AND ISOLATION: To help decrease the spread of COVID-19 please remain isolated if you have COVID infection or are highly suspected to have COVID infection. This means -stay home and isolate to one room in the home if you live with others. Do not share a bed or bathroom with others while ill, sanitize and wipe down all countertops and keep common areas clean and disinfected. You may discontinue isolation if you have a mild case and are asymptomatic 10 days after symptom onset as long as you have been fever free >24 hours without having to take Motrin or Tylenol. If your case is more severe (meaning you develop pneumonia or are admitted in the hospital), you may have to isolate longer.   If you have been in close contact (within 6 feet) of someone diagnosed with COVID 19, you are advised to quarantine in your home for 14 days as symptoms can develop anywhere from 2-14 days after exposure to the virus. If you develop symptoms, you  must isolate.  Most current guidelines for COVID after exposure -isolate 10 days if you ARE NOT tested for COVID as long as symptoms do not develop -isolate 7 days if you are tested and remain asymptomatic -You do not necessarily need to be tested for COVID if you have + exposure and        develop   symptoms. Just isolate at home x10 days from symptom onset During this global pandemic, CDC advises to practice social distancing, try to stay at least 386ft away from others at all times. Wear a face covering. Wash and sanitize your hands regularly and avoid going anywhere that is not necessary.  KEEP IN MIND THAT THE COVID TEST IS NOT 100% ACCURATE AND YOU SHOULD STILL DO EVERYTHING TO PREVENT POTENTIAL SPREAD OF VIRUS TO OTHERS  (WEAR MASK, WEAR GLOVES, WASH HANDS AND SANITIZE REGULARLY). IF INITIAL TEST IS NEGATIVE, THIS MAY NOT MEAN YOU ARE DEFINITELY NEGATIVE. MOST ACCURATE TESTING IS DONE 5-7 DAYS AFTER EXPOSURE.   It is not advised by CDC to get re-tested after receiving a positive COVID test since you can still test positive for weeks to months after you have already cleared the virus.   *If you have not been vaccinated for COVID, I strongly suggest you consider getting vaccinated as long as there are no contraindications.      ED Prescriptions    None     PDMP not reviewed this encounter.   Shirlee Latchaves, Jigar Zielke B, PA-C 02/05/20 1155

## 2020-02-11 ENCOUNTER — Ambulatory Visit (INDEPENDENT_AMBULATORY_CARE_PROVIDER_SITE_OTHER): Payer: HRSA Program

## 2020-02-11 ENCOUNTER — Other Ambulatory Visit: Payer: Self-pay

## 2020-02-11 ENCOUNTER — Encounter: Payer: Self-pay | Admitting: Emergency Medicine

## 2020-02-11 ENCOUNTER — Ambulatory Visit
Admission: EM | Admit: 2020-02-11 | Discharge: 2020-02-11 | Disposition: A | Discharge status: Home / Self Care | Payer: HRSA Program | Attending: Sports Medicine | Admitting: Sports Medicine

## 2020-02-11 DIAGNOSIS — R410 Disorientation, unspecified: Secondary | ICD-10-CM | POA: Diagnosis not present

## 2020-02-11 DIAGNOSIS — R059 Cough, unspecified: Secondary | ICD-10-CM | POA: Diagnosis not present

## 2020-02-11 DIAGNOSIS — G4700 Insomnia, unspecified: Secondary | ICD-10-CM

## 2020-02-11 DIAGNOSIS — R5383 Other fatigue: Secondary | ICD-10-CM | POA: Diagnosis not present

## 2020-02-11 DIAGNOSIS — R0602 Shortness of breath: Secondary | ICD-10-CM

## 2020-02-11 DIAGNOSIS — Z20822 Contact with and (suspected) exposure to covid-19: Secondary | ICD-10-CM | POA: Insufficient documentation

## 2020-02-11 DIAGNOSIS — F17210 Nicotine dependence, cigarettes, uncomplicated: Secondary | ICD-10-CM | POA: Insufficient documentation

## 2020-02-11 MED ORDER — TRAZODONE HCL 50 MG PO TABS: 50.0000 mg | ORAL_TABLET | Freq: Every day | ORAL | 0 refills | Status: DC

## 2020-02-11 NOTE — Discharge Instructions (Signed)
See attached patient instructions

## 2020-02-11 NOTE — ED Triage Notes (Signed)
Pt c/o shortness of breath, nasal congestion. Started about 4 days ago. Denies fever. He also states he has been very anxious and not sleeping well.

## 2020-02-11 NOTE — ED Provider Notes (Signed)
[facility]    CSN: [id number] Arrival date & time: 02/11/20  1351      History   Chief Complaint Chief Complaint  Patient presents with  . Shortness of Breath    [phone]    HPI Samuel Davila is a 30 y.o. male.   Patient pleasant 30 year old male who presents for evaluation of several days of shortness of breath wheeze congestion cough.  He also says that he feels a little bit confused at times.  He attributes this to a lack of sleep.  He says he just feels a little bit foggy.  No dizziness, headache, or lightheadedness.  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  He denies any history of asthma COPD or any significant cardiac history.  No fever shakes chills.  No nausea vomiting or diarrhea.  He has not been vaccinated against Covid or influenza.  He denies any recent Covid exposure or history of Covid.  He does say that he has a intermittent smoker.  He has been using Mucinex for the congestion with limited success.  He comes in today for evaluation of his URI symptoms but also his insomnia.  No other issues or problems are offered.     History reviewed. No pertinent past medical history.  Patient Active Problem List   Diagnosis Date Noted  . Accidental drug overdose   . AKI (acute kidney injury) (HCC) 01/12/2019    Past Surgical History:  Procedure Laterality Date  . NO PAST SURGERIES         Home Medications    Prior to Admission medications   Medication Sig Start Date End Date Taking? Authorizing Provider  traZODone (DESYREL) 50 MG tablet Take 1 tablet (50 mg total) by mouth at bedtime. 02/11/20  Yes Delton See, MD  acetaminophen (TYLENOL) 325 MG tablet Take 2 tablets (650 mg total) by mouth every 6 (six) hours as needed for mild pain (or Fever >/= 101). 01/14/19   Thomasenia Bottoms, MD  mupirocin ointment (BACTROBAN) 2 % Apply 1 application topically 3 (three) times daily. 09/20/19   Lutricia Feil, PA-C  nystatin  (MYCOSTATIN) 100000 UNIT/ML suspension Take 5 mLs (500,000 Units total) by mouth 4 (four) times daily. 09/20/19   Lutricia Feil, PA-C  escitalopram (LEXAPRO) 10 MG tablet Take 1 tablet (10 mg total) by mouth daily. 01/15/19 09/20/19  Thomasenia Bottoms, MD  pantoprazole (PROTONIX) 20 MG tablet Take 2 tablets (40 mg total) by mouth daily. Patient not taking: Reported on 01/12/2019 09/02/18 09/20/19  Tommie Sams, DO  potassium chloride SA (K-DUR) 20 MEQ tablet Take 1 tablet (20 mEq total) by mouth 2 (two) times daily for 3 days. 09/02/18 09/20/19  Tommie Sams, DO  promethazine (PHENERGAN) 25 MG tablet Take 1 tablet (25 mg total) by mouth every 8 (eight) hours as needed for nausea, vomiting or refractory nausea / vomiting. 01/14/19 09/20/19  Thomasenia Bottoms, MD    Family History Family History  Problem Relation Age of Onset  . Healthy Mother   . Healthy Father     Social History Social History   Tobacco Use  . Smoking status: Current Every Day Smoker    Packs/day: 1.00    Types: Cigarettes  . Smokeless tobacco: Never Used  Vaping Use  . Vaping Use: Never used  Substance Use Topics  . Alcohol use: Yes    Comment: occasionally  . Drug use: Yes    Frequency: 2.0 times per week  Types: Marijuana     Allergies   Patient has no known allergies.   Review of Systems Review of Systems  Constitutional: Negative for activity change, appetite change and chills.  HENT: Positive for congestion. Negative for ear discharge, ear pain, postnasal drip, sinus pressure, sinus pain and sore throat.   Eyes: Negative for pain.  Respiratory: Positive for cough, shortness of breath and wheezing. Negative for apnea, chest tightness and stridor.   Cardiovascular: Negative for chest pain and palpitations.  Gastrointestinal: Negative for abdominal pain.  Genitourinary: Negative for dysuria.  Skin: Negative for color change, pallor, rash and wound.  Neurological: Negative for dizziness, weakness,  light-headedness, numbness and headaches.  Psychiatric/Behavioral: Positive for decreased concentration and sleep disturbance. Negative for agitation, behavioral problems, confusion, dysphoric mood, hallucinations, self-injury and suicidal ideas. The patient is not nervous/anxious.   All other systems reviewed and are negative.    Physical Exam Triage Vital Signs ED Triage Vitals  Enc Vitals Group     BP 02/11/20 1552 (!) 174/71     Pulse Rate 02/11/20 1552 96     Resp 02/11/20 1552 18     Temp 02/11/20 1552 98.4 F (36.9 C)     Temp Source 02/11/20 1552 Oral     SpO2 02/11/20 1552 98 %     Weight 02/11/20 1524 264 lb 15.9 oz (120.2 kg)     Height 02/11/20 1524 5\' 11"  (1.803 m)     Head Circumference --      Peak Flow --      Pain Score 02/11/20 1523 0     Pain Loc --      Pain Edu? --      Excl. in [place]? --    No data found.  Updated Vital Signs BP (!) 174/71 (BP Location: Right Arm)   Pulse 96   Temp 98.4 F (36.9 C) (Oral)   Resp 18   Ht 5\' 11"  (1.803 m)   Wt 120.2 kg   SpO2 98%   BMI 36.96 kg/m   Visual Acuity Right Eye Distance:   Left Eye Distance:   Bilateral Distance:    Right Eye Near:   Left Eye Near:    Bilateral Near:     Physical Exam Vitals and nursing note reviewed.  Constitutional:      General: He is not in acute distress.    Appearance: He is well-developed. He is not ill-appearing or toxic-appearing.  HENT:     Head: Normocephalic and atraumatic.     Mouth/Throat:     Mouth: Mucous membranes are moist.     Pharynx: No pharyngeal swelling or oropharyngeal exudate.  Eyes:     Extraocular Movements: Extraocular movements intact.     Pupils: Pupils are equal, round, and reactive to light.  Neck:     Thyroid: No thyromegaly.     Vascular: No hepatojugular reflux or JVD.     Trachea: No tracheal deviation.  Cardiovascular:     Rate and Rhythm: Normal rate and regular rhythm.  No extrasystoles are present.    Pulses: No decreased pulses.      Heart sounds: No murmur heard. No friction rub. No gallop.   Pulmonary:     Effort: Pulmonary effort is normal. No tachypnea.     Breath sounds: No stridor. No decreased breath sounds, wheezing, rhonchi or rales.  Lymphadenopathy:     Cervical: Cervical adenopathy present.  Skin:    General: Skin is warm and dry.  Capillary Refill: Capillary refill takes less than 2 seconds.  Neurological:     General: No focal deficit present.     Mental Status: He is alert.  Psychiatric:        Mood and Affect: Mood normal.        Behavior: Behavior normal.      UC Treatments / Results  Labs (all labs ordered are listed, but only abnormal results are displayed) Labs Reviewed  SARS CORONAVIRUS 2 (TAT 6-24 HRS)    EKG   Radiology DG Chest 2 View  Result Date: 02/11/2020 CLINICAL DATA:  Shortness of breath EXAM: CHEST - 2 VIEW COMPARISON:  January 12, 2019 FINDINGS: Lungs are clear. Heart size and pulmonary vascularity are normal. No adenopathy. No bone lesions. IMPRESSION: No edema or airspace opacity.  Cardiac silhouette normal. Electronically Signed   By: Bretta Bang III M.D.   On: 02/11/2020 18:24    Procedures Procedures (including critical care time)  Medications Ordered in UC Medications - No data to display  Initial Impression / Assessment and Plan / UC Course  I have reviewed the triage vital signs and the nursing notes.  Pertinent labs & imaging results that were available during my care of the patient were reviewed by me and considered in my medical decision making (see chart for details).  Clinical impression: 1.  Several days of shortness of breath wheeze congestion and cough. 2.  Decreased sleep pattern with decreased concentration during the day with a nonfocal neurological examination.  Treatment plan: 1.  The findings and treatment plan were discussed in detail with the patient.  The patient was in agreement. 2.  Patient felt as though he was wheezing  but on my exam I did not appreciate this.  We discussed doing a chest x-ray and he wanted to proceed with that.  Results as above.  No acute cardiopulmonary process. 3.  We will get a Covid test sent off to the hospital as we do not have any rapid test.  Pending the results he may need to quarantine longer than a few days.  I have asked him to going to quarantine until he has a documented negative test.  Someone from our office will contact him if his test is positive.  Otherwise he needs to check his chart and make sure that his results are there and that he is negative. 4.  Regarding his insomnia I believe due to his body habitus is probably due to obstructive sleep apnea.  I believe he is probably having significant hypoxic events and apneic events while he is sleeping.  He will check with his primary care physician or reach out to some sleep centers to see if he can get a study and a CPAP machine.  I also advised him that he may be able to get a dental appliance to assist with his sleep from a dentist.  His uninsured status may complicate his situation.  He seems open though to pursuing both of these options. 5.  He discussed medication with me about his sleep.  I did give him trazodone 50 mg nightly #30 with no refills. 6.  For now supportive care, plenty of rest, plenty of fluids, Tylenol or Motrin for fever discomfort.  Over-the-counter meds as needed. 7.  I offered him a school/work note but he declined on. 8.  Red flag signs and symptoms were discussed in detail and when to seek out immediate medical attention.  He voiced verbal understanding. 9.  He  will follow up with [place] as needed.     Final Clinical Impressions(s) / UC Diagnoses   Final diagnoses:  SOB (shortness of breath)  Cough  Tired  Fatigue, unspecified type  Insomnia, unspecified type     Discharge Instructions     See attached patient instructions.    ED Prescriptions    Medication Sig Dispense Auth. Provider    traZODone (DESYREL) 50 MG tablet Take 1 tablet (50 mg total) by mouth at bedtime. 30 tablet Delton See, MD     PDMP not reviewed this encounter.   Delton See, MD 02/12/20 Marlyne Beards

## 2020-02-12 LAB — SARS CORONAVIRUS 2 (TAT 6-24 HRS): SARS Coronavirus 2: NEGATIVE

## 2020-02-17 ENCOUNTER — Ambulatory Visit
Admission: EM | Admit: 2020-02-17 | Discharge: 2020-02-17 | Disposition: A | Discharge status: Home / Self Care | Payer: Self-pay | Attending: Emergency Medicine | Admitting: Emergency Medicine

## 2020-02-17 ENCOUNTER — Other Ambulatory Visit: Payer: Self-pay

## 2020-02-17 DIAGNOSIS — G4700 Insomnia, unspecified: Secondary | ICD-10-CM

## 2020-02-17 MED ORDER — GABAPENTIN 600 MG PO TABS: 600.0000 mg | ORAL_TABLET | Freq: Every day | ORAL | 1 refills | Status: DC

## 2020-02-17 MED ORDER — BUSPIRONE HCL 7.5 MG PO TABS: 7.5000 mg | ORAL_TABLET | Freq: Two times a day (BID) | ORAL | 1 refills | Status: DC

## 2020-02-17 NOTE — ED Triage Notes (Addendum)
Pt states he was seen here for insomnia and SOB last week.  States he was given Trazodone. He has been taking but doesn't seem to be helping. States worsening sx for past 2 days along with anxiety. No PCP

## 2020-02-17 NOTE — Discharge Instructions (Addendum)
Discontinue the trazodone at bedtime and start the gabapentin 600 mg 20 minutes before bed.  Start the buspirone 7.5 mg twice daily for your anxiety symptoms.  You need to engage with an in person therapist for cognitive behavioral therapy to help you with your sleep issues.  You stated that you have been to [facility] before so I would recommend trying to make an appointment with one of their behavioral health specialist first.  If you are unable to make appointment with [facility] you can access the [facility] STEP clinic at [facility] in [facility].  The address is [address], [address], [place].  [address].  You need to follow good sleep hygiene practices as well.  This includes disengaging from screen time at least an hour before bedtime.  Setting of established bedtime and keeping it to develop a routine.  Exercising 4 hours before bed to increase your body's core temperature and as your body cools it will help induce sleep.

## 2020-02-17 NOTE — ED Provider Notes (Signed)
[facility]    CSN: [id number] Arrival date & time: 02/17/20  0802      History   Chief Complaint Chief Complaint  Patient presents with  . Insomnia    HPI Samuel Davila is a 30 y.o. male.   HPI   30 year old male here for evaluation of insomnia.  Patient was seen and evaluated on 02/11/2020 for URI symptoms and also insomnia.  Patient reports his URI symptoms have resolved but he is continuing to suffer from insomnia and reports that he has not slept at all in the last 48 hours.  Patient states that he has been using the trazodone and the first 2 nights it helped him fall asleep but he was not able to stay asleep.  Patient reports is not helping at all right now.  Patient states that he has used multiple over-the-counter's without any relief as well.  Patient states that his anxiety is "through the roof" but he is not currently treated.  Patient states that he does see a therapist through an app weekly but does not engage in one-on-one cognitive behavioral therapy.  Patient denies any chest pain, shortness of breath family history of insomnia or bipolar disorder.  Patient does report that there is a family history of anxiety.    History reviewed. No pertinent past medical history.  Patient Active Problem List   Diagnosis Date Noted  . Accidental drug overdose   . AKI (acute kidney injury) (HCC) 01/12/2019    Past Surgical History:  Procedure Laterality Date  . NO PAST SURGERIES         Home Medications    Prior to Admission medications   Medication Sig Start Date End Date Taking? Authorizing Provider  busPIRone (BUSPAR) 7.5 MG tablet Take 1 tablet (7.5 mg total) by mouth 2 (two) times daily. 02/17/20  Yes Becky Augusta, NP  gabapentin (NEURONTIN) 600 MG tablet Take 1 tablet (600 mg total) by mouth at bedtime. 02/17/20  Yes Becky Augusta, NP  acetaminophen (TYLENOL) 325 MG tablet Take 2 tablets (650 mg total) by mouth every 6 (six) hours as needed for mild pain  (or Fever >/= 101). 01/14/19   Thomasenia Bottoms, MD  mupirocin ointment (BACTROBAN) 2 % Apply 1 application topically 3 (three) times daily. 09/20/19   Lutricia Feil, PA-C  nystatin (MYCOSTATIN) 100000 UNIT/ML suspension Take 5 mLs (500,000 Units total) by mouth 4 (four) times daily. 09/20/19   Lutricia Feil, PA-C  escitalopram (LEXAPRO) 10 MG tablet Take 1 tablet (10 mg total) by mouth daily. 01/15/19 09/20/19  Thomasenia Bottoms, MD  pantoprazole (PROTONIX) 20 MG tablet Take 2 tablets (40 mg total) by mouth daily. Patient not taking: Reported on 01/12/2019 09/02/18 09/20/19  Tommie Sams, DO  potassium chloride SA (K-DUR) 20 MEQ tablet Take 1 tablet (20 mEq total) by mouth 2 (two) times daily for 3 days. 09/02/18 09/20/19  Tommie Sams, DO  promethazine (PHENERGAN) 25 MG tablet Take 1 tablet (25 mg total) by mouth every 8 (eight) hours as needed for nausea, vomiting or refractory nausea / vomiting. 01/14/19 09/20/19  Thomasenia Bottoms, MD  traZODone (DESYREL) 50 MG tablet Take 1 tablet (50 mg total) by mouth at bedtime. 02/11/20 02/17/20  Delton See, MD    Family History Family History  Problem Relation Age of Onset  . Healthy Mother   . Healthy Father     Social History Social History   Tobacco Use  . Smoking status: Current Every Day Smoker  Packs/day: 1.00    Types: Cigarettes  . Smokeless tobacco: Never Used  Vaping Use  . Vaping Use: Never used  Substance Use Topics  . Alcohol use: Yes    Comment: occasionally  . Drug use: Yes    Frequency: 2.0 times per week    Types: Marijuana    Comment: last use 4 days ago     Allergies   Patient has no known allergies.   Review of Systems Review of Systems  Respiratory: Negative for shortness of breath.   Cardiovascular: Negative for chest pain.  Psychiatric/Behavioral: Positive for sleep disturbance. Negative for self-injury and suicidal ideas. The patient is nervous/anxious.      Physical Exam Triage Vital Signs ED Triage  Vitals  Enc Vitals Group     BP      Pulse      Resp      Temp      Temp src      SpO2      Weight      Height      Head Circumference      Peak Flow      Pain Score      Pain Loc      Pain Edu?      Excl. in GC?    No data found.  Updated Vital Signs BP (!) 146/94 (BP Location: Right Arm)   Pulse (!) 120   Temp 99.9 F (37.7 C) (Oral)   Resp 20   Ht 5\' 11"  (1.803 m)   Wt 264 lb 8.8 oz (120 kg)   SpO2 97%   BMI 36.90 kg/m   Visual Acuity Right Eye Distance:   Left Eye Distance:   Bilateral Distance:    Right Eye Near:   Left Eye Near:    Bilateral Near:     Physical Exam Vitals reviewed.  Constitutional:      General: He is not in acute distress.    Appearance: Normal appearance. He is obese. He is not toxic-appearing.  HENT:     Head: Normocephalic and atraumatic.  Cardiovascular:     Rate and Rhythm: Tachycardia present.     Pulses: Normal pulses.     Heart sounds: Normal heart sounds. No murmur heard. No gallop.   Pulmonary:     Effort: Pulmonary effort is normal.     Breath sounds: Normal breath sounds. No wheezing, rhonchi or rales.  Skin:    General: Skin is warm and dry.     Capillary Refill: Capillary refill takes less than 2 seconds.  Neurological:     General: No focal deficit present.     Mental Status: He is alert and oriented to person, place, and time.  Psychiatric:        Mood and Affect: Mood normal.        Behavior: Behavior normal.        Thought Content: Thought content normal.        Judgment: Judgment normal.      UC Treatments / Results  Labs (all labs ordered are listed, but only abnormal results are displayed) Labs Reviewed - No data to display  EKG   Radiology No results found.  Procedures Procedures (including critical care time)  Medications Ordered in UC Medications - No data to display  Initial Impression / Assessment and Plan / UC Course  I have reviewed the triage vital signs and the nursing  notes.  Pertinent labs & imaging results that were available  during my care of the patient were reviewed by me and considered in my medical decision making (see chart for details).   Patient is here for evaluation of worsening sleep disturbance and anxiety.  Patient is currently taking trazodone but states it is not helping, not there are any over-the-counter supplements, and he reports he is seeing a therapist through an app on his phone.  Patient does not have a PCP at present nor does he see anyone with behavioral health.  Patient has seen [place] [facility] in the past.  Patient reports that in the last 48 hours his insomnia has become worse and he has not slept at all.  Patient denies any family history of bipolar disorder but the lack of sleep inability to get to sleep or stay asleep may be signs of mild mania.  I have talked with the patient and suggested that he make an appointment to see behavioral health services at [facility] or access the walk-in psychiatry clinic at [facility] in [place].  I will give her resources in his discharge instructions.  Will start buspirone 7.5 mg twice daily to manage anxiety, DC trazodone and start on gabapentin 600 mg at bedtime.   Final Clinical Impressions(s) / UC Diagnoses   Final diagnoses:  Insomnia, unspecified type     Discharge Instructions     Discontinue the trazodone at bedtime and start the gabapentin 600 mg 20 minutes before bed.  Start the buspirone 7.5 mg twice daily for your anxiety symptoms.  You need to engage with an in person therapist for cognitive behavioral therapy to help you with your sleep issues.  You stated that you have been to [facility] before so I would recommend trying to make an appointment with one of their behavioral health specialist first.  If you are unable to make appointment with [facility] you can access the [facility] STEP clinic at [facility] in [facility].  The address is [address], [place].  [address].  You need to follow good sleep hygiene practices as well.  This includes disengaging from screen time at least an hour before bedtime.  Setting of established bedtime and keeping it to develop a routine.  Exercising 4 hours before bed to increase your body's core temperature and as your body cools it will help induce sleep.    ED Prescriptions    Medication Sig Dispense Auth. Provider   busPIRone (BUSPAR) 7.5 MG tablet Take 1 tablet (7.5 mg total) by mouth 2 (two) times daily. 60 tablet Becky Augusta, NP   gabapentin (NEURONTIN) 600 MG tablet Take 1 tablet (600 mg total) by mouth at bedtime. 30 tablet Becky Augusta, NP     PDMP not reviewed this encounter.   Becky Augusta, NP 02/17/20 [phone]

## 2020-06-10 ENCOUNTER — Ambulatory Visit: Admit: 2020-06-10 | Payer: Self-pay

## 2020-06-11 ENCOUNTER — Other Ambulatory Visit: Payer: Self-pay

## 2020-06-11 ENCOUNTER — Ambulatory Visit
Admission: EM | Admit: 2020-06-11 | Discharge: 2020-06-11 | Disposition: A | Discharge status: Home / Self Care | Payer: Self-pay

## 2020-06-11 ENCOUNTER — Ambulatory Visit: Payer: Self-pay

## 2020-06-11 DIAGNOSIS — F19939 Other psychoactive substance use, unspecified with withdrawal, unspecified: Secondary | ICD-10-CM

## 2020-06-11 MED ORDER — GABAPENTIN 600 MG PO TABS: 600.0000 mg | ORAL_TABLET | Freq: Every day | ORAL | 1 refills | Status: DC

## 2020-06-11 MED ORDER — BACLOFEN 10 MG PO TABS: 10.0000 mg | ORAL_TABLET | Freq: Three times a day (TID) | ORAL | 0 refills | Status: DC

## 2020-06-11 NOTE — ED Triage Notes (Signed)
Patient states that he has multiple questions about the vaccine. States that he has to get this for travel. States that he has other questions for the provider but will not discuss with me.

## 2020-06-11 NOTE — Discharge Instructions (Signed)
COVID-vaccine: As we discussed I think it is best that you take either the Pfizer or Moderna mRNA vaccine for prevention of COVID before going to [place].  Given the compressed timeframe the Pfizer vaccine may be your best option as it is a 2 dose series that is separated by 3 weeks and the Moderna separated by 4 weeks.  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  Take the gabapentin 600 mg at bedtime as you have been previously prescribed.  You can take the baclofen up to 3 times a day with food.  You need to touch base with your psychiatrist for continued management of your withdrawal from this supplement.

## 2020-06-11 NOTE — ED Provider Notes (Signed)
[facility]    CSN: [id number] Arrival date & time: 06/11/20  0853      History   Chief Complaint Chief Complaint  Patient presents with  . Vaccine Questions    HPI Samuel Davila is a 30 y.o. male.   HPI   30 year old male here because he has questions about which COVID-vaccine he should take prior to his wife going to [place] and also because he is having some withdrawal symptoms from [employer].  She reports that he stopped taking this supplement, which she is taking for energy, and was experiencing auditory hallucinations to include hearing voices and also hearing his dog.  Patient denies any suicidal or homicidal ideation.  History reviewed. No pertinent past medical history.  Patient Active Problem List   Diagnosis Date Noted  . Accidental drug overdose   . AKI (acute kidney injury) (HCC) 01/12/2019    Past Surgical History:  Procedure Laterality Date  . NO PAST SURGERIES         Home Medications    Prior to Admission medications   Medication Sig Start Date End Date Taking? Authorizing Provider  acetaminophen (TYLENOL) 325 MG tablet Take 2 tablets (650 mg total) by mouth every 6 (six) hours as needed for mild pain (or Fever >/= 101). 01/14/19  Yes Thomasenia Bottoms, MD  baclofen (LIORESAL) 10 MG tablet Take 1 tablet (10 mg total) by mouth 3 (three) times daily. 06/11/20  Yes Becky Augusta, NP  gabapentin (NEURONTIN) 600 MG tablet Take 1 tablet (600 mg total) by mouth at bedtime. 06/11/20   Becky Augusta, NP  sertraline (ZOLOFT) 50 MG tablet Take 1 tablet by mouth every morning. 06/01/20   [provider]  escitalopram (LEXAPRO) 10 MG tablet Take 1 tablet (10 mg total) by mouth daily. 01/15/19 09/20/19  Thomasenia Bottoms, MD  pantoprazole (PROTONIX) 20 MG tablet Take 2 tablets (40 mg total) by mouth daily. Patient not taking: Reported on 01/12/2019 09/02/18 09/20/19  Tommie Sams, DO  potassium chloride SA (K-DUR) 20 MEQ tablet Take 1 tablet (20 mEq total) by  mouth 2 (two) times daily for 3 days. 09/02/18 09/20/19  Tommie Sams, DO  promethazine (PHENERGAN) 25 MG tablet Take 1 tablet (25 mg total) by mouth every 8 (eight) hours as needed for nausea, vomiting or refractory nausea / vomiting. 01/14/19 09/20/19  Thomasenia Bottoms, MD  traZODone (DESYREL) 50 MG tablet Take 1 tablet (50 mg total) by mouth at bedtime. 02/11/20 02/17/20  Delton See, MD    Family History Family History  Problem Relation Age of Onset  . Healthy Mother   . Healthy Father     Social History Social History   Tobacco Use  . Smoking status: Current Every Day Smoker    Packs/day: 1.00    Types: Cigarettes  . Smokeless tobacco: Never Used  Vaping Use  . Vaping Use: Never used  Substance Use Topics  . Alcohol use: Yes    Comment: occasionally  . Drug use: Yes    Frequency: 2.0 times per week    Types: Marijuana    Comment: last use 4 days ago     Allergies   Patient has no known allergies.   Review of Systems Review of Systems  Constitutional: Negative for fever.  Gastrointestinal: Negative for nausea and vomiting.  Skin: Negative for rash.  Neurological: Negative for dizziness, syncope and headaches.  Hematological: Negative.   Psychiatric/Behavioral: Positive for hallucinations. Negative for self-injury and suicidal ideas.  Physical Exam Triage Vital Signs ED Triage Vitals  Enc Vitals Group     BP 06/11/20 0907 (!) 154/88     Pulse Rate 06/11/20 0907 (!) 110     Resp 06/11/20 0907 16     Temp 06/11/20 0907 99.6 F (37.6 C)     Temp Source 06/11/20 0907 Oral     SpO2 06/11/20 0907 97 %     Weight 06/11/20 0905 265 lb (120.2 kg)     Height 06/11/20 0905 5\' 11"  (1.803 m)     Head Circumference --      Peak Flow --      Pain Score 06/11/20 0905 0     Pain Loc --      Pain Edu? --      Excl. in GC? --    No data found.  Updated Vital Signs BP (!) 154/88 (BP Location: Left Arm)   Pulse (!) 110   Temp 99.6 F (37.6 C) (Oral)   Resp 16    Ht 5\' 11"  (1.803 m)   Wt 265 lb (120.2 kg)   SpO2 97%   BMI 36.96 kg/m   Visual Acuity Right Eye Distance:   Left Eye Distance:   Bilateral Distance:    Right Eye Near:   Left Eye Near:    Bilateral Near:     Physical Exam Vitals and nursing note reviewed.  Constitutional:      General: He is not in acute distress.    Appearance: Normal appearance. He is obese. He is not diaphoretic.  HENT:     Head: Normocephalic and atraumatic.  Cardiovascular:     Rate and Rhythm: Normal rate and regular rhythm.     Pulses: Normal pulses.     Heart sounds: Normal heart sounds. No murmur heard. No gallop.   Pulmonary:     Effort: Pulmonary effort is normal.     Breath sounds: Normal breath sounds. No wheezing or rales.  Skin:    General: Skin is warm and dry.     Capillary Refill: Capillary refill takes less than 2 seconds.     Findings: No erythema or rash.  Neurological:     General: No focal deficit present.     Mental Status: He is alert and oriented to person, place, and time.     Motor: No weakness.  Psychiatric:        Mood and Affect: Mood normal.        Behavior: Behavior normal.        Thought Content: Thought content normal.        Judgment: Judgment normal.      UC Treatments / Results  Labs (all labs ordered are listed, but only abnormal results are displayed) Labs Reviewed - No data to display  EKG   Radiology No results found.  Procedures Procedures (including critical care time)  Medications Ordered in UC Medications - No data to display  Initial Impression / Assessment and Plan / UC Course  I have reviewed the triage vital signs and the nursing notes.  Pertinent labs & imaging results that were available during my care of the patient were reviewed by me and considered in my medical decision making (see chart for details).   Patient is a nontoxic-appearing 30 year old male here for evaluation and discussion of various issues.  The first issue is  that he has been taking a supplement called ZaZa Silver for energy and he reports that when he stopped taking it  he was experience some auditory hallucinations.  He denies SI or HI, insomnia, confusion, rapid heart rate or elevated blood pressure, loss of coordination, sweating, or fever.  Patient is taking 50 mg daily.  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  Contain the primary ingredient tianeptine which is now a schedule to narcotic as of 2021.  The silver version has phenibut which is a neuropsychotropic drug.  Patient physical exam reveals no pressured speech and a clear coherent thought pattern.  Patient is not diaphoretic and pupil response is normal.  Cardiopulmonary exam is benign.  Patient advised that he needs to take one of the mRNA vaccines, preferably Pfizer given his compressed timeframe to go to [place] for COVID prevention and he is requesting baclofen and gabapentin to help manage his withdrawal symptoms from his supplement he has been taking.  Patient has been on gabapentin in the past.   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  Given the compressed timeframe the Pfizer vaccine may be your best option as it is a 2 dose series that is separated by 3 weeks and the Moderna separated by 4 weeks.  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  Take the gabapentin 600 mg at bedtime as you have been previously prescribed.  You can take the baclofen up to 3 times a day with food.  You need to touch base with your psychiatrist for continued management of your withdrawal from this  supplement.    ED Prescriptions    Medication Sig Dispense Auth. Provider   gabapentin (NEURONTIN) 600 MG tablet Take 1 tablet (600 mg total) by mouth at bedtime. 14 tablet Becky Augusta, NP   baclofen (LIORESAL) 10 MG tablet Take 1 tablet (10 mg total) by mouth 3 (three) times daily. 30 each Becky Augusta, NP     PDMP not reviewed this encounter.   Becky Augusta, NP 06/11/20 [phone]

## 2020-10-25 ENCOUNTER — Ambulatory Visit
Admission: EM | Admit: 2020-10-25 | Discharge: 2020-10-25 | Disposition: A | Discharge status: Home / Self Care | Payer: Self-pay

## 2020-10-25 ENCOUNTER — Other Ambulatory Visit: Payer: Self-pay

## 2020-10-25 ENCOUNTER — Encounter: Payer: Self-pay | Admitting: Emergency Medicine

## 2020-10-25 DIAGNOSIS — R519 Headache, unspecified: Secondary | ICD-10-CM

## 2020-10-25 MED ORDER — GABAPENTIN 600 MG PO TABS: 600.0000 mg | ORAL_TABLET | Freq: Every day | ORAL | 1 refills | Status: DC

## 2020-10-25 MED ORDER — BACLOFEN 10 MG PO TABS: 10.0000 mg | ORAL_TABLET | Freq: Three times a day (TID) | ORAL | 1 refills | Status: DC

## 2020-10-25 NOTE — ED Provider Notes (Signed)
[facility]    CSN: [id number] Arrival date & time: 10/25/20  0912      History   Chief Complaint Chief Complaint  Patient presents with   Headache    HPI Samuel Davila is a 30 y.o. male.   HPI  30 year old male here for evaluation of headache.  Patient reports that he has been experiencing headache for the last 3 days.  That the headaches come and go and sometimes in the back of his neck and sometimes on both sides of his frontal area.  These are not associated with change in vision, nausea or vomiting, numbness, tingling, or weakness in any of his extremities.  He was evaluated in this urgent care on 06/11/2020 for hallucinations secondary to consumption of Zaza silver which is a supplement he was taking for energy.  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  He reports that he has been unsuccessful at becoming sober from the supplements.  This medication contains tianeptine which is a scheduled narcotic as well as phenibut a neuropsychotropic drug.  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  He is down to his last 6 capsules at which point he intends to stop taking the medication.  He believes that his headaches are secondary to his weaning off of the supplement.  He is also been moving and doing a lot of heavy lifting and thinks he may have aggravated her previous upper back injury.  Patient is mildly tachycardic and hypertensive here, both of which are symptoms of taking the Zaza Silver supplement.  History reviewed. No pertinent past medical history.  Patient Active Problem List   Diagnosis Date Noted   Accidental drug overdose    AKI (acute kidney injury) (HCC) 01/12/2019    Past Surgical History:  Procedure Laterality Date   NO PAST SURGERIES         Home Medications    Prior to Admission medications   Medication Sig Start Date End  Date Taking? Authorizing Provider  amLODipine (NORVASC) 5 MG tablet Take 5 mg by mouth daily. 10/21/20  Yes [provider]  ibuprofen (ADVIL) 600 MG tablet Take 600 mg by mouth every 8 (eight) hours as needed.   Yes [provider]  acetaminophen (TYLENOL) 325 MG tablet Take 2 tablets (650 mg total) by mouth every 6 (six) hours as needed for mild pain (or Fever >/= 101). 01/14/19   Thomasenia Bottoms, MD  baclofen (LIORESAL) 10 MG tablet Take 1 tablet (10 mg total) by mouth 3 (three) times daily. 10/25/20   Becky Augusta, NP  gabapentin (NEURONTIN) 600 MG tablet Take 1 tablet (600 mg total) by mouth at bedtime. 10/25/20   Becky Augusta, NP  sertraline (ZOLOFT) 50 MG tablet Take 1 tablet by mouth every morning. 06/01/20   [provider]  escitalopram (LEXAPRO) 10 MG tablet Take 1 tablet (10 mg total) by mouth daily. 01/15/19 09/20/19  Thomasenia Bottoms, MD  pantoprazole (PROTONIX) 20 MG tablet Take 2 tablets (40 mg total) by mouth daily. Patient not taking: Reported on 01/12/2019 09/02/18 09/20/19  Tommie Sams, DO  potassium chloride SA (K-DUR) 20 MEQ tablet Take 1 tablet (20 mEq total) by mouth 2 (two) times daily for 3 days. 09/02/18 09/20/19  Tommie Sams, DO  promethazine (PHENERGAN) 25 MG tablet Take 1 tablet (25 mg total) by mouth every 8 (eight) hours  as needed for nausea, vomiting or refractory nausea / vomiting. 01/14/19 09/20/19  Thomasenia Bottoms, MD  traZODone (DESYREL) 50 MG tablet Take 1 tablet (50 mg total) by mouth at bedtime. 02/11/20 02/17/20  Delton See, MD    Family History Family History  Problem Relation Age of Onset   Healthy Mother    Healthy Father     Social History Social History   Tobacco Use   Smoking status: Every Day    Packs/day: 1.00    Types: Cigarettes   Smokeless tobacco: Never  Vaping Use   Vaping Use: Never used  Substance Use Topics   Alcohol use: Yes    Comment: occasionally   Drug use: Yes    Frequency: 2.0 times per week    Types:  Marijuana     Allergies   Patient has no known allergies.   Review of Systems Review of Systems  Constitutional:  Negative for fever.  Neurological:  Positive for headaches. Negative for syncope, weakness and numbness.  Hematological: Negative.   Psychiatric/Behavioral: Negative.      Physical Exam Triage Vital Signs ED Triage Vitals  Enc Vitals Group     BP 10/25/20 1020 (!) 176/97     Pulse Rate 10/25/20 1020 (!) 101     Resp 10/25/20 1016 18     Temp 10/25/20 1016 99.2 F (37.3 C)     Temp Source 10/25/20 1016 Oral     SpO2 10/25/20 1020 97 %     Weight --      Height --      Head Circumference --      Peak Flow --      Pain Score 10/25/20 1014 6     Pain Loc --      Pain Edu? --      Excl. in GC? --    No data found.  Updated Vital Signs BP (!) 176/97 (BP Location: Left Arm) Comment: last took BP med yesterday  Pulse (!) 101   Temp 99.2 F (37.3 C) (Oral)   Resp 18   SpO2 97%   Visual Acuity Right Eye Distance:   Left Eye Distance:   Bilateral Distance:    Right Eye Near:   Left Eye Near:    Bilateral Near:     Physical Exam Vitals and nursing note reviewed.  HENT:     Head: Normocephalic and atraumatic.     Mouth/Throat:     Mouth: Mucous membranes are moist.     Pharynx: Oropharynx is clear. No posterior oropharyngeal erythema.  Eyes:     General: No scleral icterus.       Right eye: No discharge.        Left eye: No discharge.     Extraocular Movements: Extraocular movements intact.     Pupils: Pupils are equal, round, and reactive to light.  Cardiovascular:     Rate and Rhythm: Normal rate and regular rhythm.     Pulses: Normal pulses.     Heart sounds: Normal heart sounds. No murmur heard.   No gallop.  Pulmonary:     Effort: Pulmonary effort is normal.     Breath sounds: Normal breath sounds. No wheezing, rhonchi or rales.  Musculoskeletal:        General: Tenderness present.     Cervical back: Normal range of motion and neck  supple. Tenderness present.  Lymphadenopathy:     Cervical: No cervical adenopathy.  Skin:    General: Skin is warm  and dry.     Capillary Refill: Capillary refill takes less than 2 seconds.     Findings: No erythema or rash.  Neurological:     General: No focal deficit present.     Mental Status: He is oriented to person, place, and time.     Cranial Nerves: No cranial nerve deficit.     Sensory: No sensory deficit.     Motor: No weakness.     Coordination: Coordination normal.     Gait: Gait normal.  Psychiatric:        Mood and Affect: Mood normal.        Behavior: Behavior normal.        Thought Content: Thought content normal.        Judgment: Judgment normal.     UC Treatments / Results  Labs (all labs ordered are listed, but only abnormal results are displayed) Labs Reviewed - No data to display  EKG   Radiology No results found.  Procedures Procedures (including critical care time)  Medications Ordered in UC Medications - No data to display  Initial Impression / Assessment and Plan / UC Course  I have reviewed the triage vital signs and the nursing notes.  Pertinent labs & imaging results that were available during my care of the patient were reviewed by me and considered in my medical decision making (see chart for details).  Patient is a nontoxic-appearing 31 year old male here requesting refills of gabapentin and also baclofen to help him with his intermittent headaches that he believes are a result of weaning down off of a supplement that he has been taking for months.  This medication/supplement as discussed in the HPI above.  Patient's physical exam reveals cranial nerves II through XII intact.  Pupils reground reactive and EOMs intact.  Patient is 5/5 upper and lower extremity strength and no abnormalities to his coordination.  There is moderate muscle tension present in bilateral cervical paraspinous regions with a greater degree of muscle tension on the  right than the left.  There is no midline spinous tenderness.  No weakness on exam.  Cardiopulmonary exam reveals clear lung sounds in all fields.  Patient was initially prescribed gabapentin to use at nighttime to help with withdrawal symptoms from the [facility] Silver.  The baclofen was also prescribed at that time to help with muscle spasms that were occurring as a result of him taking the medication.  I have advised the patient that even though his his father is his accountability partner he may receive some benefit from contacting Narcotics Anonymous and attending their meetings.  Will prescribe a refill of gabapentin and baclofen as patient does have muscle tension in his neck.  I have encouraged the patient to continue with his weaning protocol and stop taking the supplement in question.  I have also advised him that he can continue to use Tylenol and ibuprofen according to the package instructions as needed for his pain.   Final Clinical Impressions(s) / UC Diagnoses   Final diagnoses:  Acute nonintractable headache, unspecified headache type     Discharge Instructions      Use the baclofen as needed for neck tightness that may be contributing to your headache.  Use the Gabapentin at bedtime for sleep.  Continue to use the OTC Tylenol and Ibuprofen as needed for your headache. Take the Ibuprofen with food.   You may receive benefit from attending Narcotics Anonymous for help in stopping using the Zaza silver.  If you  develop a constant headache, especially with associated nausea or vomiting, changes your vision, numbness, tingling, or weakness in any of extremities please go to the ER for evaluation.     ED Prescriptions     Medication Sig Dispense Auth. Provider   baclofen (LIORESAL) 10 MG tablet Take 1 tablet (10 mg total) by mouth 3 (three) times daily. 30 each Becky Augusta, NP   gabapentin (NEURONTIN) 600 MG tablet Take 1 tablet (600 mg total) by mouth at bedtime. 14 tablet Becky Augusta, NP      PDMP not reviewed this encounter.   Becky Augusta, NP 10/25/20 1103

## 2020-10-25 NOTE — ED Triage Notes (Addendum)
Pt presents today with c/o of headache x 3 days. Denies fever or n/v/d. He took Ibuprofen 600 mg at approx 7:30 a.m. He also c/o continued neck pain and is requesting refill of muscle relaxer.

## 2020-10-25 NOTE — Discharge Instructions (Addendum)
Use the baclofen as needed for neck tightness that may be contributing to your headache.  Use the Gabapentin at bedtime for sleep.  Continue to use the OTC Tylenol and Ibuprofen as needed for your headache. Take the Ibuprofen with food.   You may receive benefit from attending Narcotics Anonymous for help in stopping using the Zaza silver.  If you develop a constant headache, especially with associated nausea or vomiting, changes your vision, numbness, tingling, or weakness in any of extremities please go to the ER for evaluation.

## 2021-04-16 ENCOUNTER — Other Ambulatory Visit: Payer: Self-pay

## 2021-04-16 ENCOUNTER — Ambulatory Visit
Admission: EM | Admit: 2021-04-16 | Discharge: 2021-04-16 | Disposition: A | Discharge status: Home / Self Care | Payer: Self-pay

## 2021-04-16 ENCOUNTER — Encounter: Payer: Self-pay | Admitting: Emergency Medicine

## 2021-04-16 DIAGNOSIS — F1193 Opioid use, unspecified with withdrawal: Secondary | ICD-10-CM

## 2021-04-16 NOTE — ED Notes (Signed)
Patient is being discharged from the Urgent Care and sent to the Emergency Department via private vehicle . Per Vernona Rieger, [place], patient is in need of higher level of care due to withdrawal symptoms. Patient is aware and verbalizes understanding of plan of care.  ?Vitals:  ? 04/16/21 0831  ?BP: (!) 152/98  ?Pulse: (!) 108  ?Resp: 16  ?Temp: 99.2 ?F (37.3 ?C)  ?SpO2: 95%  ?  ?

## 2021-04-16 NOTE — ED Provider Notes (Signed)
?[facility] ? ? ? ?CSN: [id number] ?Arrival date & time: 04/16/21  0816 ? ? ?  ? ?History   ?Chief Complaint ?Chief Complaint  ?Patient presents with  ? Anxiety  ? Insomnia  ? ? ?HPI ?Samuel Davila is a 31 y.o. male presenting with anxiety and insomnia following detoxing from [place].  History of accidental drug overdose, AKI.  Describes anxiety, insomnia, diarrhea.  Denies chest pain, shortness of breath.  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  Last dose of nausea was 3 days ago. ? ?HPI ? ?History reviewed. No pertinent past medical history. ? ?Patient Active Problem List  ? Diagnosis Date Noted  ? Accidental drug overdose   ? AKI (acute kidney injury) (HCC) 01/12/2019  ? ? ?Past Surgical History:  ?Procedure Laterality Date  ? NO PAST SURGERIES    ? ? ? ? ? ?Home Medications   ? ?Prior to Admission medications   ?Medication Sig Start Date End Date Taking? Authorizing Provider  ?sertraline (ZOLOFT) 50 MG tablet Take 1 tablet by mouth every morning. 06/01/20  Yes [provider]  ?acetaminophen (TYLENOL) 325 MG tablet Take 2 tablets (650 mg total) by mouth every 6 (six) hours as needed for mild pain (or Fever >/= 101). 01/14/19   Thomasenia Bottoms, MD  ?ibuprofen (ADVIL) 600 MG tablet Take 600 mg by mouth every 8 (eight) hours as needed.    [provider]  ?escitalopram (LEXAPRO) 10 MG tablet Take 1 tablet (10 mg total) by mouth daily. 01/15/19 09/20/19  Thomasenia Bottoms, MD  ?pantoprazole (PROTONIX) 20 MG tablet Take 2 tablets (40 mg total) by mouth daily. ?Patient not taking: Reported on 01/12/2019 09/02/18 09/20/19  Tommie Sams, DO  ?potassium chloride SA (K-DUR) 20 MEQ tablet Take 1 tablet (20 mEq total) by mouth 2 (two) times daily for 3 days. 09/02/18 09/20/19  Tommie Sams, DO  ?promethazine (PHENERGAN) 25 MG tablet Take 1 tablet (25 mg total) by mouth every 8 (eight) hours as needed for nausea, vomiting or  refractory nausea / vomiting. 01/14/19 09/20/19  Thomasenia Bottoms, MD  ?traZODone (DESYREL) 50 MG tablet Take 1 tablet (50 mg total) by mouth at bedtime. 02/11/20 02/17/20  Delton See, MD  ? ? ?Family History ?Family History  ?Problem Relation Age of Onset  ? Healthy Mother   ? Healthy Father   ? ? ?Social History ?Social History  ? ?Tobacco Use  ? Smoking status: Former  ?  Packs/day: 1.00  ?  Types: Cigarettes  ? Smokeless tobacco: Never  ?Vaping Use  ? Vaping Use: Never used  ?Substance Use Topics  ? Alcohol use: Yes  ?  Comment: occasionally  ? Drug use: Not Currently  ?  Frequency: 2.0 times per week  ?  Types: Marijuana  ? ? ? ?Allergies   ?Patient has no known allergies. ? ? ?Review of Systems ?Review of Systems ? ? ?Physical Exam ?Triage Vital Signs ?ED Triage Vitals  ?Enc Vitals Group  ?   BP 04/16/21 0831 (!) 152/98  ?   Pulse Rate 04/16/21 0831 (!) 108  ?   Resp 04/16/21 0831 16  ?   Temp 04/16/21 0831 99.2 ?F (37.3 ?C)  ?   Temp Source 04/16/21 0831 Oral  ?   SpO2 04/16/21 0831 95 %  ?   Weight 04/16/21 0828 265 lb (120.2 kg)  ?   Height  04/16/21 0828 5\' 11"  (1.803 m)  ?   Head Circumference --   ?   Peak Flow --   ?   Pain Score 04/16/21 0828 8  ?   Pain Loc --   ?   Pain Edu? --   ?   Excl. in GC? --   ? ?No data found. ? ?Updated Vital Signs ?BP (!) 152/98 (BP Location: Left Arm)   Pulse (!) 108   Temp 99.2 ?F (37.3 ?C) (Oral)   Resp 16   Ht 5\' 11"  (1.803 m)   Wt 265 lb (120.2 kg)   SpO2 95%   BMI 36.96 kg/m?  ? ?Visual Acuity ?Right Eye Distance:   ?Left Eye Distance:   ?Bilateral Distance:   ? ?Right Eye Near:   ?Left Eye Near:    ?Bilateral Near:    ? ?Physical Exam ?Vitals reviewed.  ?Constitutional:   ?   General: He is not in acute distress. ?   Appearance: Normal appearance. He is not ill-appearing.  ?HENT:  ?   Head: Normocephalic and atraumatic.  ?Cardiovascular:  ?   Rate and Rhythm: Tachycardia present.  ?Pulmonary:  ?   Effort: Pulmonary effort is normal.  ?Neurological:  ?   General:  No focal deficit present.  ?   Mental Status: He is alert and oriented to person, place, and time.  ?Psychiatric:     ?   Mood and Affect: Mood is anxious.     ?   Behavior: Behavior normal.     ?   Thought Content: Thought content normal.     ?   Judgment: Judgment normal.  ? ? ? ?UC Treatments / Results  ?Labs ?(all labs ordered are listed, but only abnormal results are displayed) ?Labs Reviewed - No data to display ? ?EKG ? ? ?Radiology ?No results found. ? ?Procedures ?Procedures (including critical care time) ? ?Medications Ordered in UC ?Medications - No data to display ? ?Initial Impression / Assessment and Plan / UC Course  ?I have reviewed the triage vital signs and the nursing notes. ? ?Pertinent labs & imaging results that were available during my care of the patient were reviewed by me and considered in my medical decision making (see chart for details). ? ?  ? ?This patient is a very pleasant 31 y.o. year old male presenting with opioid detox. Discussed that I am not able to manage acute opioid withdrawal in the urgent care setting, particularly in the context of multiple failed outpatient rehab attempts - suspect he would benefit from inpatient rehab at this point. Unfortunately as it is the weekend, there are limited resources available, so he will have to head to the ED. He is in agreement.  ? ?Final Clinical Impressions(s) / UC Diagnoses  ? ?Final diagnoses:  ?Opioid withdrawal (HCC)  ? ? ? ?Discharge Instructions   ? ?  ?-Sent to ED via personal vehicle. Declines AVS. ? ? ?ED Prescriptions   ?None ?  ? ?PDMP not reviewed this encounter. ?  ? , PA-C ?04/16/21 0913 ? ?

## 2021-04-16 NOTE — Discharge Instructions (Addendum)
-  Sent to ED via personal vehicle. Declines AVS. ?

## 2021-04-16 NOTE — ED Triage Notes (Signed)
Patient states that he has been having anxiety, difficulty sleeping and some diarrhea that started Wed afternoon.  Patient states that he is going through withdrawal symptoms from the ZaZa from the vape stores. Patient states that the ZaZas are pills and that his last dose was 7am on Wed.   ?

## 2021-05-11 IMAGING — CR DG CHEST 2V
2 series · 2 of 2 positions shown · non-contrast
Comparison: January 12, 2019

CLINICAL DATA: Shortness of breath

EXAM:
CHEST - 2 VIEW

[chest pa]
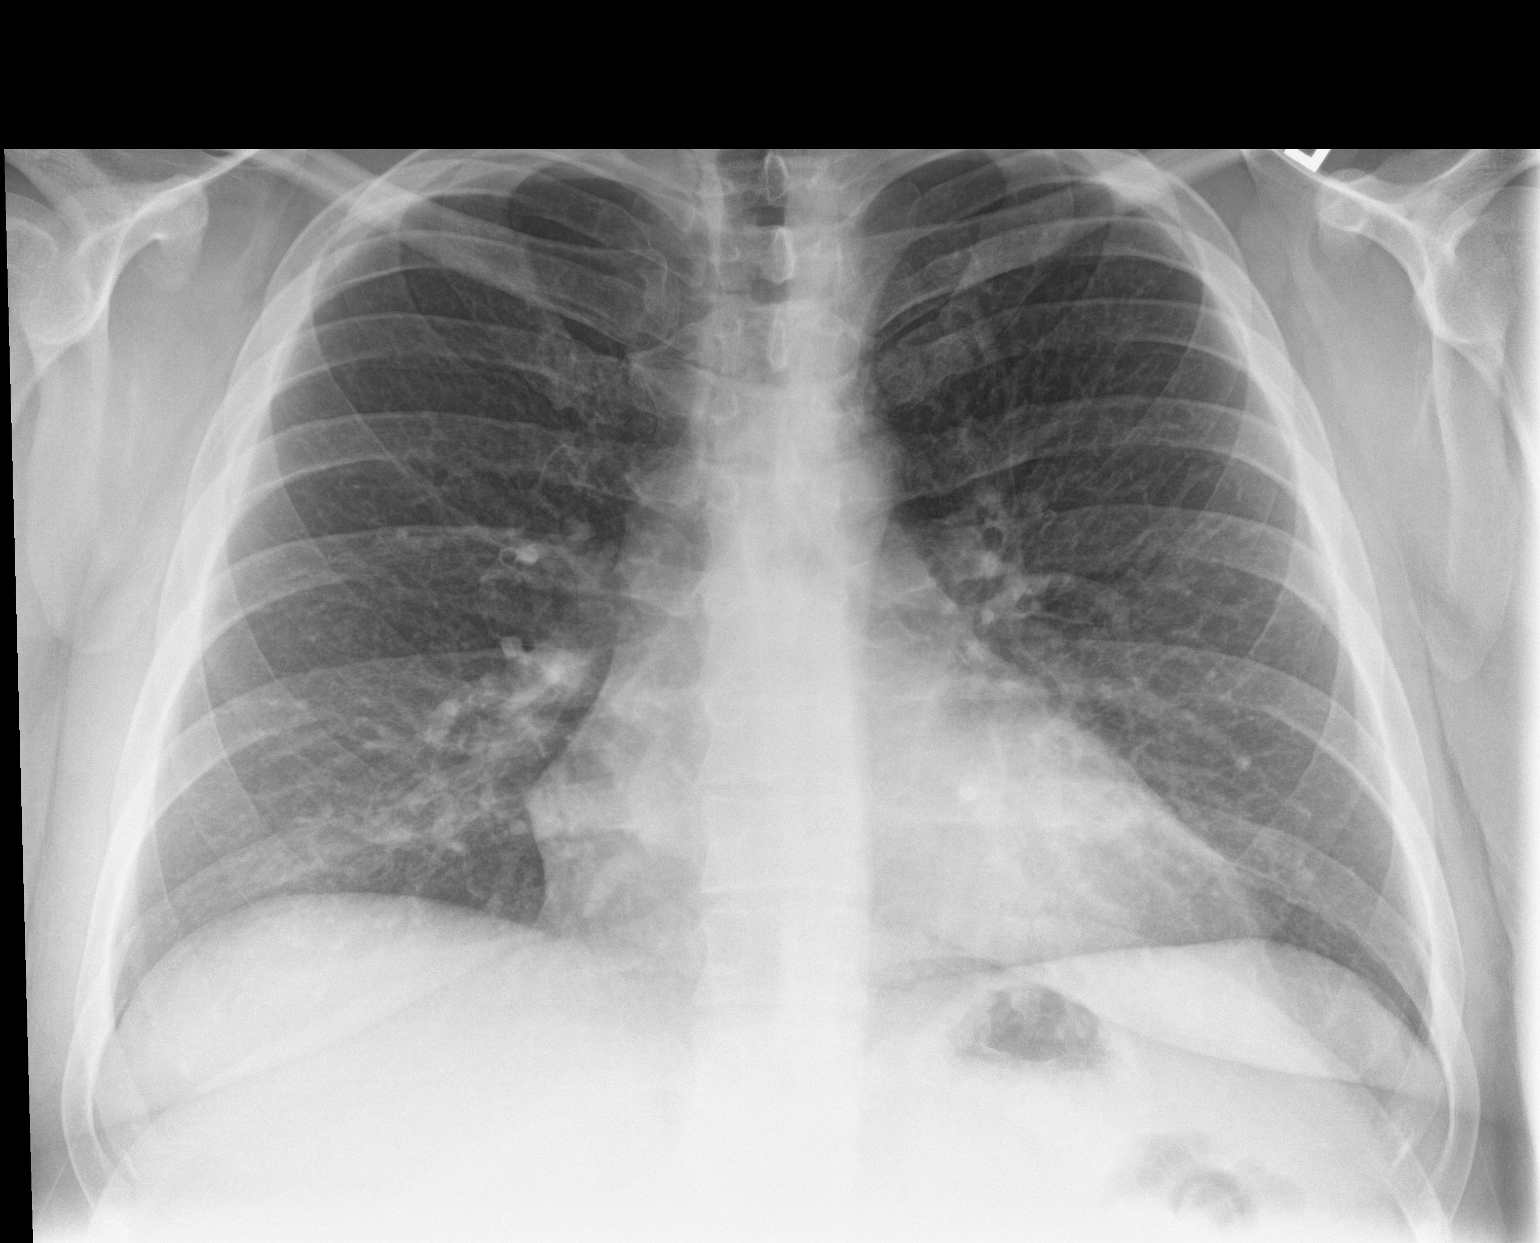

[chest lat]
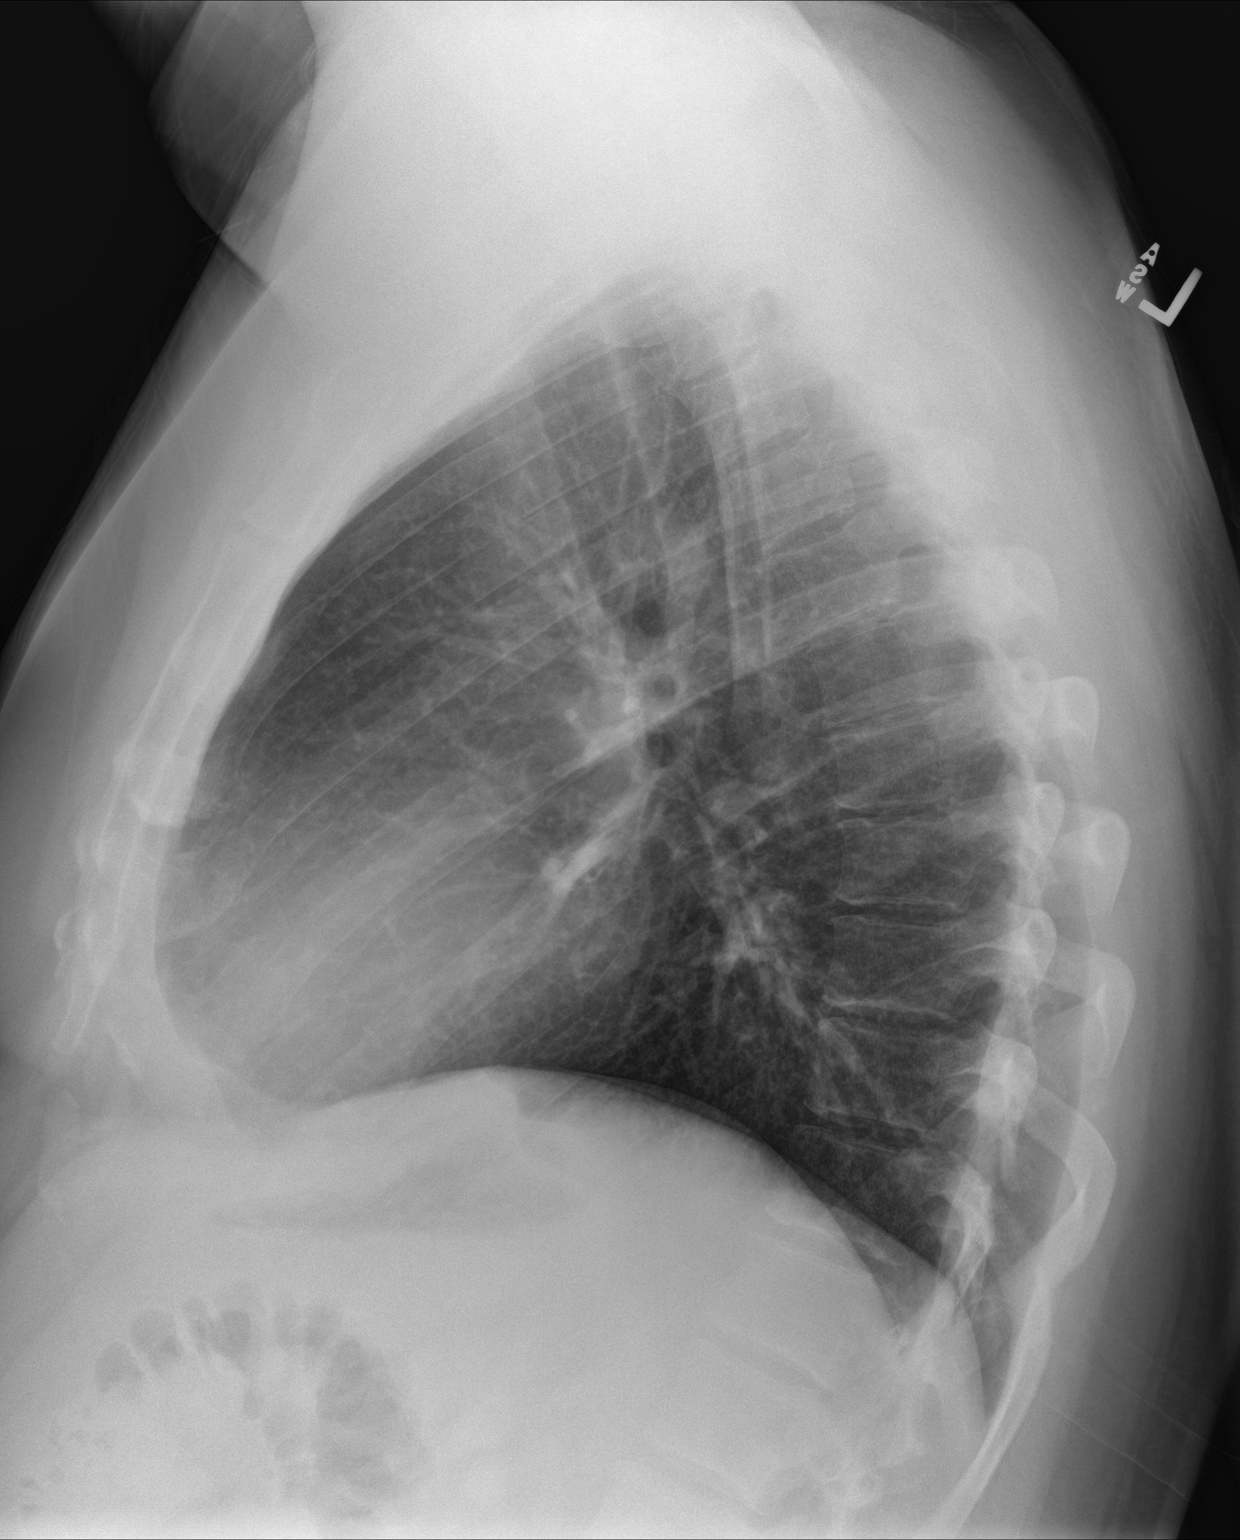

[2 of 2 positions shown; findings below may reference images not displayed]

FINDINGS: Lungs are clear. Heart size and pulmonary vascularity are normal. No
adenopathy. No bone lesions.
IMPRESSION: No edema or airspace opacity.  Cardiac silhouette normal.

## 2022-07-25 ENCOUNTER — Other Ambulatory Visit: Payer: Self-pay
